# Patient Record
Sex: Male | Born: 1965
Health system: Southern US, Community
[De-identification: ages and names within clinical notes are randomized; demographics above are authoritative.]

## PROBLEM LIST (undated history)

## (undated) DIAGNOSIS — U071 COVID-19: Secondary | ICD-10-CM

## (undated) DIAGNOSIS — I2510 Atherosclerotic heart disease of native coronary artery without angina pectoris: Secondary | ICD-10-CM

## (undated) DIAGNOSIS — N529 Male erectile dysfunction, unspecified: Secondary | ICD-10-CM

## (undated) DIAGNOSIS — B029 Zoster without complications: Secondary | ICD-10-CM

## (undated) DIAGNOSIS — T7840XA Allergy, unspecified, initial encounter: Secondary | ICD-10-CM

## (undated) DIAGNOSIS — Q5310 Unspecified undescended testicle, unilateral: Secondary | ICD-10-CM

## (undated) DIAGNOSIS — K219 Gastro-esophageal reflux disease without esophagitis: Secondary | ICD-10-CM

## (undated) DIAGNOSIS — M48061 Spinal stenosis, lumbar region without neurogenic claudication: Secondary | ICD-10-CM

## (undated) DIAGNOSIS — R1031 Right lower quadrant pain: Secondary | ICD-10-CM

## (undated) DIAGNOSIS — M4310 Spondylolisthesis, site unspecified: Secondary | ICD-10-CM

## (undated) DIAGNOSIS — E781 Pure hyperglyceridemia: Secondary | ICD-10-CM

## (undated) HISTORY — DX: Spondylolisthesis, site unspecified: M43.10

## (undated) HISTORY — DX: Allergy, unspecified, initial encounter: T78.40XA

## (undated) HISTORY — DX: Spinal stenosis, lumbar region without neurogenic claudication: M48.061

## (undated) HISTORY — DX: Gastro-esophageal reflux disease without esophagitis: K21.9

## (undated) HISTORY — DX: Zoster without complications: B02.9

## (undated) HISTORY — PX: FRACTURE SURGERY: SHX138

## (undated) HISTORY — PX: OTHER SURGICAL HISTORY: SHX169

## (undated) HISTORY — DX: Male erectile dysfunction, unspecified: N52.9

## (undated) HISTORY — DX: Right lower quadrant pain: R10.31

## (undated) HISTORY — DX: Unspecified undescended testicle, unilateral: Q53.10

## (undated) HISTORY — DX: COVID-19: U07.1

## (undated) HISTORY — DX: Pure hyperglyceridemia: E78.1

---

## 2002-06-23 ENCOUNTER — Encounter: Admission: RE | Admit: 2002-06-23 | Discharge: 2002-09-21 | Payer: Self-pay | Admitting: Family Medicine

## 2006-06-11 ENCOUNTER — Emergency Department (HOSPITAL_COMMUNITY): Admission: EM | Admit: 2006-06-11 | Discharge: 2006-06-12 | Payer: Self-pay | Admitting: Emergency Medicine

## 2006-06-21 ENCOUNTER — Ambulatory Visit: Payer: Self-pay | Admitting: Family Medicine

## 2006-06-23 ENCOUNTER — Ambulatory Visit: Payer: Self-pay | Admitting: Family Medicine

## 2006-06-23 LAB — CONVERTED CEMR LAB
Chol/HDL Ratio, serum: 5.5
LDL Cholesterol: 89 mg/dL (ref 0–99)
Triglyceride fasting, serum: 139 mg/dL (ref 0–149)

## 2006-10-06 ENCOUNTER — Ambulatory Visit: Payer: Self-pay | Admitting: Family Medicine

## 2006-10-06 LAB — CONVERTED CEMR LAB
Chlamydia, Swab/Urine, PCR: NEGATIVE
GC Probe Amp, Urine: NEGATIVE

## 2006-11-03 ENCOUNTER — Ambulatory Visit: Payer: Self-pay | Admitting: Family Medicine

## 2006-11-08 ENCOUNTER — Ambulatory Visit: Payer: Self-pay | Admitting: Family Medicine

## 2006-11-17 ENCOUNTER — Ambulatory Visit: Payer: Self-pay | Admitting: Family Medicine

## 2006-11-17 ENCOUNTER — Encounter: Payer: Self-pay | Admitting: Family Medicine

## 2006-11-17 DIAGNOSIS — E781 Pure hyperglyceridemia: Secondary | ICD-10-CM | POA: Insufficient documentation

## 2006-11-17 DIAGNOSIS — E785 Hyperlipidemia, unspecified: Secondary | ICD-10-CM | POA: Insufficient documentation

## 2007-01-19 ENCOUNTER — Telehealth (INDEPENDENT_AMBULATORY_CARE_PROVIDER_SITE_OTHER): Payer: Self-pay | Admitting: *Deleted

## 2007-01-20 ENCOUNTER — Ambulatory Visit: Payer: Self-pay | Admitting: Family Medicine

## 2007-01-20 DIAGNOSIS — J309 Allergic rhinitis, unspecified: Secondary | ICD-10-CM | POA: Insufficient documentation

## 2007-01-24 ENCOUNTER — Telehealth: Payer: Self-pay | Admitting: Internal Medicine

## 2007-01-24 ENCOUNTER — Encounter: Admission: RE | Admit: 2007-01-24 | Discharge: 2007-01-24 | Payer: Self-pay | Admitting: Family Medicine

## 2007-01-26 ENCOUNTER — Ambulatory Visit: Payer: Self-pay | Admitting: Internal Medicine

## 2007-02-08 ENCOUNTER — Encounter (INDEPENDENT_AMBULATORY_CARE_PROVIDER_SITE_OTHER): Payer: Self-pay | Admitting: Family Medicine

## 2007-03-03 ENCOUNTER — Ambulatory Visit: Payer: Self-pay | Admitting: Family Medicine

## 2007-03-03 DIAGNOSIS — F528 Other sexual dysfunction not due to a substance or known physiological condition: Secondary | ICD-10-CM | POA: Insufficient documentation

## 2007-03-03 DIAGNOSIS — Q539 Undescended testicle, unspecified: Secondary | ICD-10-CM | POA: Insufficient documentation

## 2007-03-03 DIAGNOSIS — K219 Gastro-esophageal reflux disease without esophagitis: Secondary | ICD-10-CM | POA: Insufficient documentation

## 2007-03-03 LAB — CONVERTED CEMR LAB
Cholesterol: 135 mg/dL (ref 0–200)
LDL Cholesterol: 80 mg/dL (ref 0–99)
Total CHOL/HDL Ratio: 4
Triglycerides: 107 mg/dL (ref 0–149)

## 2007-03-04 ENCOUNTER — Telehealth (INDEPENDENT_AMBULATORY_CARE_PROVIDER_SITE_OTHER): Payer: Self-pay | Admitting: *Deleted

## 2007-03-07 LAB — CONVERTED CEMR LAB
Testosterone Free: 121.5 pg/mL (ref 47.0–244.0)
Testosterone-% Free: 2.8 % (ref 1.6–2.9)

## 2007-04-22 ENCOUNTER — Ambulatory Visit: Payer: Self-pay | Admitting: Family Medicine

## 2007-06-10 ENCOUNTER — Ambulatory Visit: Payer: Self-pay | Admitting: Family Medicine

## 2007-07-22 ENCOUNTER — Telehealth (INDEPENDENT_AMBULATORY_CARE_PROVIDER_SITE_OTHER): Payer: Self-pay | Admitting: *Deleted

## 2007-08-18 HISTORY — PX: VASECTOMY: SHX75

## 2007-08-24 ENCOUNTER — Ambulatory Visit: Payer: Self-pay | Admitting: Family Medicine

## 2007-12-20 ENCOUNTER — Ambulatory Visit: Payer: Self-pay | Admitting: Internal Medicine

## 2008-04-03 ENCOUNTER — Telehealth (INDEPENDENT_AMBULATORY_CARE_PROVIDER_SITE_OTHER): Payer: Self-pay | Admitting: *Deleted

## 2008-04-09 ENCOUNTER — Encounter: Payer: Self-pay | Admitting: Internal Medicine

## 2008-04-12 ENCOUNTER — Ambulatory Visit: Payer: Self-pay | Admitting: Internal Medicine

## 2008-04-12 DIAGNOSIS — R079 Chest pain, unspecified: Secondary | ICD-10-CM | POA: Insufficient documentation

## 2008-04-16 ENCOUNTER — Encounter: Admission: RE | Admit: 2008-04-16 | Discharge: 2008-04-16 | Payer: Self-pay | Admitting: Internal Medicine

## 2008-04-16 ENCOUNTER — Encounter (INDEPENDENT_AMBULATORY_CARE_PROVIDER_SITE_OTHER): Payer: Self-pay | Admitting: *Deleted

## 2008-04-26 ENCOUNTER — Telehealth (INDEPENDENT_AMBULATORY_CARE_PROVIDER_SITE_OTHER): Payer: Self-pay | Admitting: *Deleted

## 2008-04-30 ENCOUNTER — Telehealth (INDEPENDENT_AMBULATORY_CARE_PROVIDER_SITE_OTHER): Payer: Self-pay | Admitting: *Deleted

## 2008-05-24 ENCOUNTER — Ambulatory Visit (HOSPITAL_BASED_OUTPATIENT_CLINIC_OR_DEPARTMENT_OTHER): Admission: RE | Admit: 2008-05-24 | Discharge: 2008-05-24 | Payer: Self-pay | Admitting: Urology

## 2008-10-22 ENCOUNTER — Ambulatory Visit: Payer: Self-pay | Admitting: Internal Medicine

## 2008-10-29 ENCOUNTER — Ambulatory Visit (HOSPITAL_COMMUNITY): Admission: RE | Admit: 2008-10-29 | Discharge: 2008-10-29 | Payer: Self-pay | Admitting: Internal Medicine

## 2008-10-29 ENCOUNTER — Encounter: Payer: Self-pay | Admitting: Internal Medicine

## 2008-10-31 ENCOUNTER — Telehealth (INDEPENDENT_AMBULATORY_CARE_PROVIDER_SITE_OTHER): Payer: Self-pay | Admitting: *Deleted

## 2008-12-13 ENCOUNTER — Telehealth (INDEPENDENT_AMBULATORY_CARE_PROVIDER_SITE_OTHER): Payer: Self-pay | Admitting: *Deleted

## 2009-03-17 DIAGNOSIS — B029 Zoster without complications: Secondary | ICD-10-CM

## 2009-03-17 HISTORY — DX: Zoster without complications: B02.9

## 2009-04-15 ENCOUNTER — Ambulatory Visit: Payer: Self-pay | Admitting: Internal Medicine

## 2009-04-17 ENCOUNTER — Telehealth: Payer: Self-pay | Admitting: Family Medicine

## 2009-07-01 ENCOUNTER — Ambulatory Visit: Payer: Self-pay | Admitting: Internal Medicine

## 2009-07-01 LAB — CONVERTED CEMR LAB
Bilirubin Urine: NEGATIVE
Blood in Urine, dipstick: NEGATIVE
Ketones, urine, test strip: NEGATIVE
Specific Gravity, Urine: 1.01
pH: 7.5

## 2009-07-02 ENCOUNTER — Encounter: Payer: Self-pay | Admitting: Internal Medicine

## 2009-07-04 ENCOUNTER — Encounter (INDEPENDENT_AMBULATORY_CARE_PROVIDER_SITE_OTHER): Payer: Self-pay | Admitting: *Deleted

## 2009-07-30 ENCOUNTER — Ambulatory Visit: Payer: Self-pay | Admitting: Internal Medicine

## 2009-08-07 ENCOUNTER — Telehealth: Payer: Self-pay | Admitting: Internal Medicine

## 2009-09-27 ENCOUNTER — Ambulatory Visit: Payer: Self-pay | Admitting: Internal Medicine

## 2009-10-11 ENCOUNTER — Ambulatory Visit: Payer: Self-pay | Admitting: Internal Medicine

## 2010-06-27 ENCOUNTER — Ambulatory Visit: Payer: Self-pay | Admitting: Internal Medicine

## 2010-06-27 LAB — CONVERTED CEMR LAB
Bacteria, UA: NONE SEEN
Bilirubin Urine: NEGATIVE
Casts: NONE SEEN /lpf
Chlamydia, Swab/Urine, PCR: NEGATIVE
GC Probe Amp, Urine: NEGATIVE
Glucose, Urine, Semiquant: NEGATIVE
Ketones, urine, test strip: NEGATIVE
Specific Gravity, Urine: 1.015
pH: 6

## 2010-06-28 ENCOUNTER — Encounter: Payer: Self-pay | Admitting: Internal Medicine

## 2010-07-07 ENCOUNTER — Encounter: Admission: RE | Admit: 2010-07-07 | Discharge: 2010-07-07 | Payer: Self-pay | Admitting: Internal Medicine

## 2010-09-04 ENCOUNTER — Ambulatory Visit
Admission: RE | Admit: 2010-09-04 | Discharge: 2010-09-04 | Payer: Self-pay | Source: Home / Self Care | Attending: Internal Medicine | Admitting: Internal Medicine

## 2010-09-04 ENCOUNTER — Other Ambulatory Visit: Payer: Self-pay | Admitting: Internal Medicine

## 2010-09-04 LAB — CBC WITH DIFFERENTIAL/PLATELET
Basophils Absolute: 0 10*3/uL (ref 0.0–0.1)
Basophils Relative: 0.6 % (ref 0.0–3.0)
Eosinophils Absolute: 0.1 10*3/uL (ref 0.0–0.7)
Eosinophils Relative: 0.9 % (ref 0.0–5.0)
HCT: 45.4 % (ref 39.0–52.0)
Hemoglobin: 15.8 g/dL (ref 13.0–17.0)
Lymphocytes Relative: 31.7 % (ref 12.0–46.0)
Lymphs Abs: 2.3 10*3/uL (ref 0.7–4.0)
MCHC: 34.8 g/dL (ref 30.0–36.0)
MCV: 88.8 fl (ref 78.0–100.0)
Monocytes Absolute: 0.4 10*3/uL (ref 0.1–1.0)
Monocytes Relative: 6.2 % (ref 3.0–12.0)
Neutro Abs: 4.4 10*3/uL (ref 1.4–7.7)
Neutrophils Relative %: 60.6 % (ref 43.0–77.0)
Platelets: 214 10*3/uL (ref 150.0–400.0)
RBC: 5.11 Mil/uL (ref 4.22–5.81)
RDW: 13.7 % (ref 11.5–14.6)
WBC: 7.3 10*3/uL (ref 4.5–10.5)

## 2010-09-04 LAB — HEPATIC FUNCTION PANEL
ALT: 26 U/L (ref 0–53)
AST: 21 U/L (ref 0–37)
Albumin: 4.5 g/dL (ref 3.5–5.2)
Alkaline Phosphatase: 42 U/L (ref 39–117)
Bilirubin, Direct: 0.2 mg/dL (ref 0.0–0.3)
Total Bilirubin: 1 mg/dL (ref 0.3–1.2)
Total Protein: 7.1 g/dL (ref 6.0–8.3)

## 2010-09-04 LAB — BASIC METABOLIC PANEL
BUN: 15 mg/dL (ref 6–23)
CO2: 30 mEq/L (ref 19–32)
Calcium: 9 mg/dL (ref 8.4–10.5)
Chloride: 101 mEq/L (ref 96–112)
Creatinine, Ser: 1 mg/dL (ref 0.4–1.5)
GFR: 84.98 mL/min (ref >60.00)
Glucose, Bld: 104 mg/dL — ABNORMAL HIGH (ref 70–99)
Potassium: 4.6 mEq/L (ref 3.5–5.1)
Sodium: 135 mEq/L (ref 135–145)

## 2010-09-04 LAB — LIPID PANEL
Cholesterol: 140 mg/dL (ref 0–200)
HDL: 32.6 mg/dL — ABNORMAL LOW (ref >39.00)
LDL Cholesterol: 69 mg/dL (ref 0–99)
Total CHOL/HDL Ratio: 4
Triglycerides: 191 mg/dL — ABNORMAL HIGH (ref 0.0–149.0)
VLDL: 38.2 mg/dL (ref 0.0–40.0)

## 2010-09-04 LAB — TSH: TSH: 0.98 u[IU]/mL (ref 0.35–5.50)

## 2010-09-16 NOTE — Assessment & Plan Note (Signed)
Summary: burning while urinating/kn   Vital Signs:  Patient profile:   45 year old male Height:      71 inches Weight:      253.13 pounds BMI:     35.43 Temp:     98.5 degrees F Pulse rate:   78 / minute Pulse rhythm:   regular BP sitting:   138 / 80  (left arm) Cuff size:   large  Vitals Entered By: Army Fossa CMA (June 27, 2010 1:50 PM) CC: Burning while urinating x 2 days.  Comments [facility] [address]    History of Present Illness: 2 days history of dysuria No gross hematuria, no difficulty urinating Denies any genital rash He does have unprotected sex with his  male  partner  Review of systems No fever No nausea or vomiting No abdominal pain or CVA tenderness No penile discharge chart reviewed, Similar symptoms 11-10, urine culture negative, was treated with doxycycline per  Dr. Alwyn Ren  Current Medications (verified): 1)  Xyzal 5 Mg  Tabs (Levocetirizine Dihydrochloride) .Marland Kitchen.. 1 By Mouth Once Daily 2)  Nexium 40 Mg  Cpdr (Esomeprazole Magnesium) .Marland Kitchen.. 1 By Mouth Once Daily 3)  Cialis 20 Mg Tabs (Tadalafil) .... Take One Tablet Every Other Day As Needed  Allergies (verified): 1)  ! * Antihistamines 2)  ! Asa 3)  ! Pcn  Past History:  Past Medical History: Reviewed history from 04/15/2009 and no changes required. ALLERGIC RHINITIS  HYPERTRIGLYCERIDEMIA ERECTILE DYSFUNCTION  GERD  UNDESCENDED TESTICLE s/p surgery as a child CP 04/09/2008 ---> stress test  (nl)  and ECHO (Nl), GB u/s neg; had a previous eval by cards as well shingles 03-2009  Past Surgical History: surgery for undescended testicle --R-- Vasectomy approximately 2009  Social History: Reviewed history from 10/11/2009 and no changes required. 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   Abdomen:  soft, non-tender, no distention, no masses, no guarding, no rigidity, and no  inguinal hernia.  no CVA tenderness Rectal:  No external abnormalities noted. Normal sphincter tone. No rectal masses or tenderness. Genitalia:  circumcised.   no pain no discharge No genital rash He has a normal left testicle Right testicle is not in the scrotum. palpation of the right side  spermatic cordom  trajectory--area  is  slightly full and tender, patient reports that is the way it has been for years Prostate:  Prostate gland firm and smooth, no enlargement, nodularity, tenderness, mass, asymmetry or induration.   Impression & Recommendations:  Problem # 1:  ? of UTI (ICD-599.0) ? UTI, ? Urethritis Plan: Urine culture, G&C Empiric antibiotics , doxycycline He is allergic to penicillin, will use cephalosporin only if the G&C is positive    Orders: UA Dipstick w/o Micro (automated)  (81003) T-Chlamydia  Probe, urine [id number]) T-GC Probe, urine [id number]) T-Urine Microscopic (29562-13086) T-Culture, Urine ([id number])  Problem # 2:  UNDESCENDED TESTICLE (ICD-752.51)  see physical exam, right side of the scrotum is empty, history of right undescended testicle. Slightly tender at the spermatic cord area We'll request ultrasound, patient agreed  Orders: Radiology Referral (Radiology)  Complete Medication List: 1)  Xyzal 5 Mg Tabs (Levocetirizine dihydrochloride) .Marland Kitchen.. 1 by mouth once daily 2)  Nexium 40 Mg Cpdr (Esomeprazole magnesium) .Marland Kitchen.. 1 by mouth once daily 3)  Cialis 20 Mg Tabs (Tadalafil) .... Take one tablet every other day as needed 4)  Doxycycline Hyclate 100  Mg Caps (Doxycycline hyclate) .... One by mouth twice a day for one week  Patient Instructions: 1)  please come back in 2 weeks if you are not better Prescriptions: DOXYCYCLINE HYCLATE 100 MG CAPS (DOXYCYCLINE HYCLATE) one by mouth twice a day for one week  #14 x 0   Entered and Authorized by:   Nolon Rod. Paz MD   Signed by:   Nolon Rod. Paz MD on 06/27/2010   Method used:   Electronically to         [facility] [phone]* (retail)       [address]       [place], [place]  [address]       Ph: [phone]       Fax: [phone]   RxID:   [id number]    Orders Added: 1)  UA Dipstick w/o Micro (automated)  [81003] 2)  T-Chlamydia  Probe, urine [id number] 3)  T-GC Probe, urine [id number] 4)  T-Urine Microscopic [[id number]] 5)  T-Culture, Urine [[id number]] 6)  Radiology Referral [Radiology] 7)  Est. Patient Level IV [01093]    Laboratory Results   Urine Tests    Routine Urinalysis   Color: yellow Appearance: Clear Glucose: negative   (Normal Range: Negative) Bilirubin: negative   (Normal Range: Negative) Ketone: negative   (Normal Range: Negative) Spec. Gravity: 1.015   (Normal Range: 1.003-1.035) Blood: negative   (Normal Range: Negative) pH: 6.0   (Normal Range: 5.0-8.0) Protein: negative   (Normal Range: Negative) Urobilinogen: 0.2   (Normal Range: 0-1) Nitrite: negative   (Normal Range: Negative) Leukocyte Esterace: negative   (Normal Range: Negative)    Comments: Army Fossa CMA  June 27, 2010 1:54 PM

## 2010-09-16 NOTE — Assessment & Plan Note (Signed)
Summary: SINUS INFECTION?/KDC   Vital Signs:  Patient profile:   45 year old male Height:      71 inches Weight:      245.4 pounds Temp:     98.6 degrees F BP sitting:   120 / 86  Vitals Entered By: Shary Decamp (September 27, 2009 8:42 AM) CC: acute only Comments  - sick x 2/6  - HA, congestion, runny nose, ST Stacia Hawks  September 27, 2009 8:48 AM    History of Present Illness: as above  headache is mostly frontal  Current Medications (verified): 1)  Xyzal 5 Mg  Tabs (Levocetirizine Dihydrochloride) .Marland Kitchen.. 1 By Mouth Once Daily 2)  Nexium 40 Mg  Cpdr (Esomeprazole Magnesium) .Marland Kitchen.. 1 By Mouth Once Daily  Allergies (verified): 1)  ! * Antihistamines 2)  ! Asa 3)  ! Pcn  Past History:  Past Medical History: Reviewed history from 04/15/2009 and no changes required. ALLERGIC RHINITIS  HYPERTRIGLYCERIDEMIA ERECTILE DYSFUNCTION  GERD  UNDESCENDED TESTICLE s/p surgery as a child CP 04/09/2008 ---> stress test  (nl)  and ECHO (Nl), GB u/s neg; had a previous eval by cards as well shingles 03-2009  Past Surgical History: Reviewed history from 04/12/2008 and no changes required. surgery for undescended testicle  Social History: Reviewed history from 10/22/2008 and no changes required. 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   nontoxic Head:  face is symmetric, not tender to the patient Ears:  both TMs bulge and slight erect, no discharge Nose:  quite congested Mouth:  no redness or discharge Lungs:  normal respiratory effort, no intercostal retractions, and no accessory muscle use.  few rhonchi Heart:  normal rate, regular rhythm, and no murmur.     Impression & Recommendations:  Problem # 1:  URI (ICD-465.9) URI symptoms plus  B OMA patient is allergic to penicillin, prescribed Bactrim His updated medication list for this problem includes:    Xyzal 5 Mg Tabs (Levocetirizine dihydrochloride) .Marland Kitchen... 1 by mouth once daily  Complete Medication List: 1)  Xyzal 5 Mg Tabs (Levocetirizine dihydrochloride) .Marland Kitchen.. 1 by mouth once daily 2)  Nexium 40 Mg Cpdr (Esomeprazole magnesium) .Marland Kitchen.. 1 by mouth once daily 3)  Bactrim Ds 800-160 Mg Tabs (Sulfamethoxazole-trimethoprim) .... One by mouth twice a day  Patient Instructions: 1)  rest, take a lot of fluids 2)  Tylenol 3)  mucinex 4)  Bactrim, antibiotic 5)  call if not better in a few days Prescriptions: BACTRIM DS 800-160 MG TABS (SULFAMETHOXAZOLE-TRIMETHOPRIM) one by mouth twice a day  #14 x 0   Entered and Authorized by:   Nolon Rod. Shenetta Schnackenberg MD   Signed by:   Nolon Rod. Nickson Middlesworth MD on 09/27/2009   Method used:   Electronically to        [facility] [phone]* (retail)       [address]       [place], [place]  [address]       Ph: [phone]       Fax: [phone]   RxID:   [id number]

## 2010-09-16 NOTE — Assessment & Plan Note (Signed)
Summary: cough/runny nose/kdc   Vital Signs:  Patient profile:   45 year old male Weight:      248 pounds Temp:     98.4 degrees F oral BP sitting:   122 / 76  (right arm)  Vitals Entered By: Doristine Devoid (October 11, 2009 3:36 PM) CC: cough and chest congestion also some sinus drainage    History of Present Illness: he was seen here 2/11, diagnosed with a URI and OMA  he was prescribed Bactrim, he took several days before he felt better but he never recuperated 100%  symptoms  but worse a few days ago:  chest and nose congestion ++ cough  Allergies: 1)  ! * Antihistamines 2)  ! Asa 3)  ! Pcn  Past History:  Past Medical History: Reviewed history from 04/15/2009 and no changes required. 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   Ears:  R ear: no redness,TM essentially normal  L ear: TM slightly bulging and red Nose:  mild congestion Mouth:  no redness Lungs:  few rhonchi with few end expiratory wheezing Heart:  normal rate, regular rhythm, no murmur, and no gallop.     Impression & Recommendations:  Problem # 1:  URI (ICD-465.9) lingering symptoms since DX  of URI a couple of weeks ago at this point, his chest seems congested, he has some wheezing (bronchitis type of symptoms) plan: Z-Pak, prednisone, samples of albuterol (sample) is not better next week, will get a  chest x-ray see  instructions His updated medication list for this problem includes:   Xyzal 5 Mg Tabs (Levocetirizine dihydrochloride) .Marland Kitchen... 1 by mouth once daily  Complete Medication List: 1)  Xyzal 5 Mg Tabs (Levocetirizine dihydrochloride) .Marland Kitchen.. 1 by mouth once daily 2)  Nexium 40 Mg Cpdr (Esomeprazole magnesium) .Marland Kitchen.. 1 by mouth once daily 3)  Zithromax Z-pak 250 Mg Tabs (Azithromycin) .... As directed 4)  Prednisone 10 Mg Tabs (Prednisone) .... 4 by mouth once daily x 2, 3x2, 2x2, 1x2  Patient Instructions: 1)  rest, take a lot of fluids  2)  mucinex DM twice a day for one week 3)  Zpack , antibiotic 4)  prednisone as prescribed 5)  albuterol two puffs every 6  hours as needed for persistent cough or wheezing 6)  call if not better  Monday  Prescriptions: PREDNISONE 10 MG TABS (PREDNISONE) 4 by mouth once daily x 2, 3x2, 2x2, 1x2  #20 x 0   Entered and Authorized by:   Jose E. Paz MD   Signed by:   Nolon Rod. Paz MD on 10/11/2009   Method used:   Electronically to        [facility] [phone]* (retail)       [address]       [place], [place]  [address]       Ph: [phone]       Fax: [phone]   RxID:   [id number] Rhys Martini  250 MG TABS (AZITHROMYCIN) as directed  #1 x 0   Entered and Authorized by:   Nolon Rod. Paz MD   Signed by:   Nolon Rod. Paz MD on 10/11/2009   Method used:   Electronically to        [facility] [phone]* (retail)       [address]       [place], [place]  [address]       Ph: [phone]       Fax: [phone]   RxID:   [id number]

## 2010-09-18 NOTE — Assessment & Plan Note (Signed)
Summary: CPX/FASTING/KN   Vital Signs:  Patient profile:   45 year old male Height:      71 inches Weight:      256 pounds BMI:     35.83 Pulse rate:   65 / minute Pulse rhythm:   regular BP sitting:   126 / 78  (left arm) Cuff size:   large  Vitals Entered By: Army Fossa CMA (September 04, 2010 10:13 AM) CC: CPX, fasting  Comments "ongoing problem that he feels like he is getting to have a stroke"  print rx's    History of Present Illness: CPX  continue with random episodes of left sided chest discomfort, tingling in the fingers. Does not feel anxious with the onset of symptoms but then he gets nervous about it. Report several previous stress test that were negative, several ER visits.  Also complained of feeling fatigue and snoring. No inappropriate falling asleep.  Sinus symptoms for a few weeks, nasal congestion, occasional green discharge. No fever Some itchy eyes, no itchy nose. Allergies? Infection?  Current Medications (verified): 1)  Xyzal 5 Mg  Tabs (Levocetirizine Dihydrochloride) .Marland Kitchen.. 1 By Mouth Once Daily 2)  Nexium 40 Mg  Cpdr (Esomeprazole Magnesium) .Marland Kitchen.. 1 By Mouth Once Daily 3)  Cialis 20 Mg Tabs (Tadalafil) .... Take One Tablet Every Other Day As Needed  Allergies (verified): 1)  ! * Antihistamines 2)  ! Asa 3)  ! Pcn  Past History:  Past Medical History: ALLERGIC RHINITIS  HYPERTRIGLYCERIDEMIA ERECTILE DYSFUNCTION  GERD  UNDESCENDED TESTICLE s/p surgery as a child-------u/s 11-11 "absent R testicle" CP 04/09/2008 ---> stress test  (nl)  and ECHO (Nl), GB u/s neg; had a previous eval by cards as well shingles 03-2009  Past Surgical History: Reviewed history from 06/27/2010 and no changes required. 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 ENT:  allergies-- xyzol helps in spring . Resp:  Denies cough and wheezing. GI:  Denies bloody stools, diarrhea, nausea, and vomiting. GU:  Denies dysuria, urinary frequency, and urinary hesitancy.  Physical Exam  General:  alert, well-developed, and overweight-appearing.   Head:  face symmetric, nontender to palpation Ears:  TMs bulging bilaterally Nose:  slightly congested Mouth:  no redness or discharge Lungs:  normal respiratory effort, no intercostal retractions, no accessory muscle use, and normal breath sounds.   Heart:  normal rate, regular rhythm, no murmur, and no gallop.   Abdomen:  soft, non-tender, no distention, no masses, no guarding, and no rigidity.   Extremities:  no lower extremity edema   Impression & Recommendations:  Problem # 1:  EXAMINATION, ROUTINE MEDICAL (ICD-V70.0) Td 2005 recommend a flu shot counseled about diet-exercise-loose wt (BMI 35) ----Weight Watchers? labs   Orders: Venipuncture (04540) TLB-BMP (Basic Metabolic Panel-BMET) (80048-METABOL) TLB-CBC Platelet - w/Differential (85025-CBCD) TLB-Lipid Panel (80061-LIPID) TLB-TSH (Thyroid Stimulating Hormone) (84443-TSH) TLB-Hepatic/Liver Function Pnl (80076-HEPATIC) Specimen Handling (98119)  Problem # 2:  CHEST PAIN (ICD-786.50) continue with episodic chest pain associated with other symptoms, see history of present illness and PMH  reports several ER visits and previews negative stress tests I think his options are : -cardiology reevaluation -Xanax  as needed, anxiety? He elected Xanax as needed. Patient will let me know if that helps. If symptoms severe, go to the ER  Problem # 3:  ? of UNSPECIFIED SLEEP APNEA (ICD-780.57) sleep apnea? The patient complained of lack of energy, is overweight, has a short neck. He is a  sleepy through the day although he smokes one sleeping appropriately. We discussed apneaLink . He will call me if interested  Problem # 4:  ALLERGIC RHINITIS (ICD-477.9) he does have symptoms consistent with allergic rhinitis, TMs are bulging. Recommend to go back xyzal , we'll prescribe steroids for a few days His updated medication list for this problem includes:    Xyzal 5 Mg Tabs (Levocetirizine dihydrochloride) .Marland Kitchen... 1 by mouth once daily  Problem # 5:   his  additiono to his  CPX, we assesed  3 other issues, see above   Complete Medication List: 1)  Xyzal 5 Mg Tabs (Levocetirizine dihydrochloride) .Marland Kitchen.. 1 by mouth once daily 2)  Nexium 40 Mg Cpdr (Esomeprazole magnesium) .Marland Kitchen.. 1 by mouth once daily 3)  Cialis 20 Mg Tabs (Tadalafil) .... Take one tablet every other day as needed 4)  Prednisone 10 Mg Tabs (Prednisone) .... 4 by mouth once daily x 2, 3x2,2x2,1x2 5)  Alprazolam 0.5 Mg Tabs (Alprazolam) .Marland Kitchen.. 1 or 2  by mouth two times a day as needed for chest pain  Patient Instructions: 1)  xyzal x 3 weeks 2)  prednisone for few days as prescribed 3)  mucinex OTC two times a day x 1 week , then as needed  4)  call if allergies not better in few days  5)  Please schedule a follow-up appointment in 6 months .  Prescriptions: PREDNISONE 10 MG TABS (PREDNISONE) 4 by mouth once daily x 2, 3x2,2x2,1x2  #20 x 0   Entered and Authorized by:   Nolon Rod. Isaia Hassell MD   Signed by:   Nolon Rod. Jarnell Cordaro MD on 09/04/2010   Method used:   Print then Give to Patient   RxID:   [id number] NEXIUM 40 MG  CPDR (ESOMEPRAZOLE MAGNESIUM) 1 by mouth once daily  #90 x 3   Entered and Authorized by:   Nolon Rod. Landy Mace MD   Signed by:   Nolon Rod. Jenah Vanasten MD on 09/04/2010   Method used:   Print then Give to Patient   RxID:   [id number] XYZAL 5 MG  TABS (LEVOCETIRIZINE DIHYDROCHLORIDE) 1 by mouth once daily  #90 x 3   Entered and Authorized by:   Nolon Rod. Kollins Fenter MD   Signed by:   Nolon Rod. Emony Dormer MD on 09/04/2010   Method used:    Print then Give to Patient   RxID:   [id number] ALPRAZOLAM 0.5 MG TABS (ALPRAZOLAM) 1 or 2  by mouth two times a day as needed for chest pain  #15 x 0   Entered and Authorized by:   Nolon Rod. Ariana Cavenaugh MD   Signed by:   Nolon Rod. Jahmar Mckelvy MD on 09/04/2010   Method used:   Print then Give to Patient   RxID:   [id number]    Orders Added: 1)  Venipuncture [42595] 2)  TLB-BMP (Basic Metabolic Panel-BMET) [80048-METABOL] 3)  TLB-CBC Platelet - w/Differential [85025-CBCD] 4)  TLB-Lipid Panel [80061-LIPID] 5)  TLB-TSH (Thyroid Stimulating Hormone) [84443-TSH] 6)  TLB-Hepatic/Liver Function Pnl [80076-HEPATIC] 7)  Specimen Handling [99000] 8)  Est. Patient Level III [63875] 9)  Est. Patient age 78-64 [phone]

## 2010-09-22 ENCOUNTER — Telehealth (INDEPENDENT_AMBULATORY_CARE_PROVIDER_SITE_OTHER): Payer: Self-pay | Admitting: *Deleted

## 2010-10-02 NOTE — Progress Notes (Signed)
Summary: Refill Request  Phone Note Refill Request Call back at [phone] Message from:  Pharmacy on September 22, 2010 8:44 AM  Refills Requested: Medication #1:  NEXIUM 40 MG  CPDR 1 by mouth once daily   Dosage confirmed as above?Dosage Confirmed   Supply Requested: 3 months   Last Refilled: 09/04/2010   Notes: New Rx [facility]  Next Appointment Scheduled: none Initial call taken by: Harold Barban,  September 22, 2010 8:45 AM  Follow-up for Phone Call        needs to go to [facility].    Prescriptions: NEXIUM 40 MG  CPDR (ESOMEPRAZOLE MAGNESIUM) 1 by mouth once daily  #90 x 3   Entered by:   Army Fossa CMA   Authorized by:   Nolon Rod. Paz MD   Signed by:   Army Fossa CMA on 09/22/2010   Method used:   Faxed to ...       [facility] (mail-order)       [address] [place], [place]  [address]       Ph: [phone]       Fax: [phone]   RxID:   [id number]

## 2010-10-15 ENCOUNTER — Encounter: Payer: Self-pay | Admitting: Internal Medicine

## 2010-10-15 ENCOUNTER — Ambulatory Visit (INDEPENDENT_AMBULATORY_CARE_PROVIDER_SITE_OTHER): Payer: Managed Care, Other (non HMO) | Admitting: Internal Medicine

## 2010-10-15 DIAGNOSIS — J019 Acute sinusitis, unspecified: Secondary | ICD-10-CM | POA: Insufficient documentation

## 2010-10-15 DIAGNOSIS — G4730 Sleep apnea, unspecified: Secondary | ICD-10-CM

## 2010-10-23 NOTE — Assessment & Plan Note (Signed)
Summary: [email or web address].cbs   Vital Signs:  Patient profile:   45 year old male Weight:      257.38 pounds Temp:     98.7 degrees F oral Pulse rate:   84 / minute Pulse rhythm:   regular BP sitting:   126 / 86  (left arm) Cuff size:   large  Vitals Entered By: Army Fossa CMA (October 15, 2010 9:51 AM) CC: Head congestion, HA, minor chest congestion Comments x 1 1/2 weeks tried mucinex [facility] [address]    History of Present Illness:   symptoms started 10 days ago, has moderate head congestion, some chest congestion. Frontal headache on and off, sore throat and postnasal dripping  Mucinex did not help much   also would like a sleep apnea test, see previous office visit note  Current Medications (verified): 1)  Xyzal 5 Mg  Tabs (Levocetirizine Dihydrochloride) .Marland Kitchen.. 1 By Mouth Once Daily 2)  Nexium 40 Mg  Cpdr (Esomeprazole Magnesium) .Marland Kitchen.. 1 By Mouth Once Daily 3)  Cialis 20 Mg Tabs (Tadalafil) .... Take One Tablet Every Other Day As Needed  Allergies (verified): 1)  ! * Antihistamines 2)  ! Asa 3)  ! Pcn  Past History:  Past Medical History: Reviewed history from 09/04/2010 and no changes required. 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 surgery for undescended testicle --R-- Vasectomy approximately 2009  Social History: Reviewed history from 09/04/2010 and no changes required. 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 Resp:  Denies cough and sputum productive. GI:  had N last week, no V-D.  Physical  Exam  General:  alert and well-developed.   no apparent distress Head:   face symmetric, not tender to palpation Ears:   tympanic membranes slightly bulged, no red Nose:   slightly congested Mouth:   no redness or discharge Lungs:  normal respiratory effort, no intercostal retractions, no accessory muscle use, and normal breath sounds.     Impression & Recommendations:  Problem # 1:  SINUSITIS - ACUTE-NOS (ICD-461.9)  mild sinusitis he is allergic to penicillin, see instructions His updated medication list for this problem includes:    Flonase 50 Mcg/act Susp (Fluticasone propionate) .Marland Kitchen... 2 sprays on each side of the nose once daily    Zithromax Z-pak 250 Mg Tabs (Azithromycin) .Marland Kitchen... As directed  Problem # 2:  ? of UNSPECIFIED SLEEP APNEA (ICD-780.57)  sleep apnea?  he would like to proceed with a apneaLink    Will schedule once he is completely well from his sinusitis  Orders: Pulmonary Referral (Pulmonary)  Complete Medication List: 1)  Xyzal 5 Mg Tabs (Levocetirizine dihydrochloride) .Marland Kitchen.. 1 by mouth once daily 2)  Nexium 40 Mg Cpdr (Esomeprazole magnesium) .Marland Kitchen.. 1 by mouth once daily 3)  Cialis 20 Mg Tabs (Tadalafil) .... Take one tablet every other day as needed 4)  Flonase 50 Mcg/act Susp (Fluticasone propionate) .... 2 sprays on each side of the nose once daily 5)  Zithromax Z-pak 250 Mg Tabs (Azithromycin) .... As directed  Patient Instructions: 1)  rest, fluids, tylenol 2)  mucinex  3)  flonase 2 sprays on each side of the nose  every day fo few weeks  4)  zpack 5)  call if no better in 2 weeks  Prescriptions: ZITHROMAX Z-PAK 250 MG TABS (AZITHROMYCIN) as directed  #1 x 0   Entered by:   Army Fossa CMA   Authorized by:   Nolon Rod. Jasmeet Manton MD   Signed by:   Army Fossa CMA on 10/15/2010   Method used:   Electronically to        [id number]* (retail)       [address]       [place], [place]  [address]        Ph: [phone]       Fax: [phone]   RxID:   [id number] FLONASE 50 MCG/ACT SUSP (FLUTICASONE PROPIONATE) 2 sprays on each side of the nose once daily  #1 x 1   Entered by:   Army Fossa CMA   Authorized by:   Nolon Rod. Chenille Toor MD   Signed by:   Army Fossa CMA on 10/15/2010   Method used:   Electronically to        [id number]* (retail)       [address]       [place], [place]  [address]       Ph: [phone]       Fax: [phone]   RxID:   [id number] ZITHROMAX Z-PAK 250 MG TABS (AZITHROMYCIN) as directed  #1 x 0   Entered and Authorized by:   Nolon Rod. Kataryna Mcquilkin MD   Signed by:   Nolon Rod. Shylin Keizer MD on 10/15/2010   Method used:   Print then Give to Patient   RxID:   [id number] FLONASE 50 MCG/ACT SUSP (FLUTICASONE PROPIONATE) 2 sprays on each side of the nose once daily  #1 x 1   Entered and Authorized by:   Elita Quick E. Abhimanyu Cruces MD   Signed by:   Nolon Rod. Kalyani Maeda MD on 10/15/2010   Method used:   Print then Give to Patient   RxID:   [id number] ZITHROMAX Z-PAK 250 MG TABS (AZITHROMYCIN) as directed  #1 x 0   Entered and Authorized by:   Nolon Rod. Hoa Briggs MD   Signed by:   Nolon Rod. Naser Schuld MD on 10/15/2010   Method used:   Electronically to        [facility] [phone]* (retail)       [address]       [place], [place]  [address]       Ph: [phone]       Fax: [phone]   RxID:   [id number] FLONASE 50 MCG/ACT SUSP (FLUTICASONE PROPIONATE) 2 sprays on each side of the nose once daily  #1 x 1   Entered and Authorized by:   Nolon Rod. Dariela Stoker MD   Signed by:   Nolon Rod. Camdon Saetern MD on 10/15/2010   Method used:   Electronically to        [facility] [phone]* (retail)       [address]       [place], [place]  [address]       Ph: [phone]       Fax: [phone]   RxID:   [id number]    Orders Added: 1)  Pulmonary Referral [Pulmonary] 2)  Est. Patient Level III [id number]

## 2010-11-03 ENCOUNTER — Ambulatory Visit (INDEPENDENT_AMBULATORY_CARE_PROVIDER_SITE_OTHER): Payer: Managed Care, Other (non HMO) | Admitting: Pulmonary Disease

## 2010-11-03 ENCOUNTER — Encounter: Payer: Self-pay | Admitting: Pulmonary Disease

## 2010-11-03 DIAGNOSIS — G4733 Obstructive sleep apnea (adult) (pediatric): Secondary | ICD-10-CM | POA: Insufficient documentation

## 2010-11-13 NOTE — Assessment & Plan Note (Signed)
Summary: apena link only/cb   Primary Provider/Referring Provider:  lown   History of Present Illness: APNEALINK HOME SLEEP STUDY REPORT:  45 yo male with sleep disruption, daytimes sleepiness, and snoring.  Sampling time 7hrs 6 min.  Valid recording time 5hr .  Average respiratory rate 15.  AHI 1, RDI 6.  All obstructive events.  Average SpO2 95%, low 87%.  Spent 1 min with SpO2 < 89%. ODI 5.  Average heart rate 68, range 58 to 99.  Allergies: 1)  ! * Antihistamines 2)  ! Asa 3)  ! Pcn  Past History:  Past Medical History: Reviewed history from 09/04/2010 and no changes required. 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 surgery for undescended testicle --R-- Vasectomy approximately 2009  Family History: Reviewed history from 09/04/2010 and no changes required. Asthma--M Diabetes  -- F, S  MI--no bone cancer- brother breast ca-- S colon ca--no  prostate ca--no  Social History: Reviewed history from 09/04/2010 and no changes required. 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  His Respiratory Disturbance Index and Oxygen Desaturation Index are elevated, suggesting that he may have sleep apnea.  However, his Apnea Hypopnea Index is in normal range.  Would recommend that the patient undergo weight loss attempts first.  If he has symptoms of sleepiness interferring with daytime function, then could consider in-lab overnight sleep  study to further assess.  Alternative would be to arrange for formal sleep consultation.  Complete Medication List: 1)  Xyzal 5 Mg Tabs (Levocetirizine dihydrochloride) .Marland Kitchen.. 1 by mouth once daily 2)  Nexium 40 Mg Cpdr (Esomeprazole magnesium) .Marland Kitchen.. 1 by mouth once daily 3)  Cialis 20 Mg Tabs (Tadalafil) .... Take one tablet every other day as needed 4)  Flonase 50 Mcg/act Susp (Fluticasone propionate) .... 2 sprays on each side of the nose once daily 5)  Zithromax Z-pak 250 Mg Tabs (Azithromycin) .... As directed  Other Orders: Sleep Std Airflow/Heartrate and O2 SAT unattended ([address])

## 2010-11-19 ENCOUNTER — Telehealth: Payer: Self-pay | Admitting: Internal Medicine

## 2010-11-19 DIAGNOSIS — G4730 Sleep apnea, unspecified: Secondary | ICD-10-CM

## 2010-11-19 NOTE — Telephone Encounter (Signed)
Advise patient, apnea link showed borderline findings for sleep apnea. Options: -Weight loss, see his dentist and get an appliance to help with snoring and reassess in one year -Consult to discuss results with pulmonary [facility]

## 2010-11-19 NOTE — Telephone Encounter (Signed)
I spoke w/ pt he would like a referral to pulmonary.

## 2010-11-24 ENCOUNTER — Encounter: Payer: Self-pay | Admitting: Pulmonary Disease

## 2010-12-12 ENCOUNTER — Encounter: Payer: Self-pay | Admitting: Pulmonary Disease

## 2010-12-16 ENCOUNTER — Ambulatory Visit (INDEPENDENT_AMBULATORY_CARE_PROVIDER_SITE_OTHER): Payer: Managed Care, Other (non HMO) | Admitting: Pulmonary Disease

## 2010-12-16 ENCOUNTER — Encounter: Payer: Self-pay | Admitting: Pulmonary Disease

## 2010-12-16 VITALS — BP 112/70 | HR 60 | Temp 98.4°F | Ht 71.0 in | Wt 258.6 lb

## 2010-12-16 DIAGNOSIS — G4733 Obstructive sleep apnea (adult) (pediatric): Secondary | ICD-10-CM

## 2010-12-16 NOTE — Progress Notes (Signed)
  Subjective:    Patient ID: Timothy [name], male    DOB: [birth year], 45 y.o.   MRN: [id number]  HPI The pt is a 45y/o male who I have been asked to see for possible osa.  He has recently undergone home sleep testing where he was found to have a normal AHI, but clinically he has had symptoms c/w sleep disordered breathing.  His history is significant for the following: - loud snoring with abnormal breathing pattern during sleep. -awakenings at night, nonrestorative sleep   -definite sleep pressure during the day with periods of inactivity, but not dysfunctional -dozes with tv/movies in the evening, some sleep pressure with driving longer distances.  -epworth 12.    Sleep Questionnaire: What time do you typically go to bed?( Between what hours) 11 pm How long does it take you to fall asleep? 1 hr + How many times during the night do you wake up? 3 What time do you get out of bed to start your day? 0700 Do you drive or operate heavy machinery in your occupation? No How much has your weight changed (up or down) over the past two years? (In pounds) 10 lb (4.536 kg) Have you ever had a sleep study before? Yes If yes, location of study? home sleep study/overnight oximetry. If yes, date of study? Do you currently use CPAP? No Do you wear oxygen at any time? No    Review of Systems  Constitutional: Negative for fever and unexpected weight change.  HENT: Positive for congestion and sneezing. Negative for ear pain, nosebleeds, sore throat, rhinorrhea, trouble swallowing, dental problem, postnasal drip and sinus pressure.   Eyes: Negative for redness and itching.  Respiratory: Negative for cough, chest tightness, shortness of breath and wheezing.   Cardiovascular: Negative for palpitations and leg swelling.  Gastrointestinal: Negative for nausea and vomiting.  Genitourinary: Negative for dysuria.  Musculoskeletal: Negative for joint swelling.  Skin: Negative for rash.  Neurological: Negative for  headaches.  Hematological: Does not bruise/bleed easily.  Psychiatric/Behavioral: Negative for dysphoric mood. The patient is not nervous/anxious.        Objective:   Physical Exam Constitutional: overweight male, no acute distress  HENT:  Nares patent without discharge, mild septal deviation to left  Oropharynx without exudate, palate and uvula are elongated.  Mild tonsillar hypertrophy  Eyes:  Perrla, eomi, no scleral icterus  Neck:  No JVD, no TMG  Cardiovascular:  Normal rate, regular rhythm, no rubs or gallops.  No murmurs        Intact distal pulses  Pulmonary :  Normal breath sounds, no stridor or respiratory distress   No rales, rhonchi, or wheezing  Abdominal:  Soft, nondistended, bowel sounds present.  No tenderness noted.   Musculoskeletal:  No lower extremity edema noted.  Lymph Nodes:  No cervical lymphadenopathy noted  Skin:  No cyanosis noted  Neurologic: appears sleepy, appropriate, moves all 4 extremities without obvious deficit.         Assessment & Plan:

## 2010-12-16 NOTE — Patient Instructions (Signed)
Work aggressively on weight loss over the next 6mos, and see how things go. If you feel you are not making progress, or if symptoms are greatly interfering with your quality of life, call and we can set up formal sleep study.

## 2010-12-16 NOTE — Assessment & Plan Note (Signed)
The pt's history is classic for sleep disordered breathing, although his home sleep test does not show significant OSA.  I suspect he has the upper airway resistance syndrome, or perhaps he had a "good night" with the home sleep testing.  Regardless, I do not think he has moderate or severe osa, which would have been picked up by home study.  At this point, we only have 2 options to consider.  He can take the next 6mos and work aggressively on weight loss, versus having a formal npsg at the sleep center for evaluation.  The pt would like to work on weight loss first and see how he does.

## 2010-12-30 NOTE — Op Note (Signed)
NAME:  Timothy [name], Timothy [name] NO.:  [id number]   MEDICAL RECORD NO.:  [id number]          PATIENT TYPE:  AMB   LOCATION:  [facility]                         FACILITY:  [facility]   PHYSICIAN:  Excell Seltzer. Annabell Howells, M.D.    DATE OF BIRTH:  Dec 29, 1965   DATE OF PROCEDURE:  05/24/2008  DATE OF DISCHARGE:                               OPERATIVE REPORT   PREOPERATIVE DIAGNOSIS:  Desires permanent sterility.   POSTOPERATIVE DIAGNOSIS:  Desires permanent sterility.   PROCEDURE:  1. Right inguinal exploration.  2. Bilateral vasectomy.   ATTENDING PHYSICIAN:  Excell Seltzer. Annabell Howells, M.D.   RESIDENT PHYSICIAN:  Dr. Delman Kitten.   ANESTHESIA:  Was general plus local.   INDICATIONS FOR PROCEDURE:  Mr. Rossin is a 45 year old white male who  has a 69 year old son and desires permanent sterility.  He did have a  right undescended testicle that was addressed at age 8 but the exact  procedure is unknown.  Preoperatively he and Dr. Annabell Howells discussed all  risks, benefits, consequences and concerns regarding vasectomy and  because of his underlying anatomic abnormality it was decided to do this  procedure in the operating room.   PROCEDURE IN DETAIL:  The patient is brought to the operating room and  placed in supine position.  He was correctly identified by his wristband  and appropriate time-out was taken.  IV antibiotics were administered  and general anesthesia was delivered.  Once adequately anesthetized his  scrotum was clipped, prepped and draped in the normal sterile fashion.  We began our procedure by performing physical examination and his left  testicle was descended with palpably normal spermatic cord.  The vas  deferens was easily identified.  The right hemiscrotum was empty and an  atrophic testicle was palpated just distal to the right superficial  inguinal ring.  It appeared to be tethered in this position and was not  mobile.  We could palpate the spermatic cord and the vas deferens but,  again, there was a fair amount of tissue involved and it was difficult  to isolate the vas deferens.  In any case we elected to began by  proceeding forth with left-sided vasectomy in a no scalpel technique.  We isolated the vas deferens and brought it up to the level of the skin.  We then used the needlepoint forceps and made a small puncture in the  scrotal skin in the midline or just to the patient's right of midline  and then spread and we were able to get a ring forceps around the vas.  We then dissected all the perivasal tissues to get down to the vas  itself.  We then isolated this with two towel clips.  A hemostat was  placed over the vas deferens and an intervening segment of the vas was  amputated sharply with scissors.  We used needlepoint electrocautery to  cauterize both proximal and distal lumens of the left vas.  The distal  end of the vas was then tucked under some dartos tissue and the dartos  was closed over it using 3-0 chromic suture  thus isolating the two ends.  The proximal aspect of the vas was then ligated with 3-0 Vicryl.  We  then replaced both vas structures into the left hemiscrotum.  We then  made an attempt to grasp the vas with a ring forceps through our initial  puncture site but this was not possible.  Likewise we continued to have  the vas isolated between our fingertips and made two other separate  puncture wounds, one to the superior medial aspect of the scrotum and  one to the superior lateral aspect of the scrotum and neither of these  was sufficient enough to deliver the vas.  We then elected to proceed  forth with inguinal exploration.  We made a superficial incision over  the superficial inguinal ring.  We carried it down using blunt  dissection until we got to the superficial inguinal ring and saw the  spermatic cord exiting it.  We then used our fingers to isolate the vas  deferens and then placed ring forceps around it here.  At this point we   then proceeded forth with standard vasectomy.   We used the needlepoint forceps to dissect out the vas from the  perivasal tissues.  Once an adequate segment of vas deferens was freed  up we then placed two hemostats over the vas and amputated a section of  the vas between the two forceps and then we tied both ends of the vas  deferens with 3-0 Vicryl suture.  The proximal end on the right side had  two ties placed on it.  Again, we closed over some dartos tissue over  the distal end of the vas thus compartmentalizing both proximal and  distal ends.  We then closed our inguinal incision in two layers.  The  deep layer was reapproximated with multiple simple interrupted inverted  sutures using 3-0 chromic and then the skin was closed with 4-0 Vicryl  in a subcuticular fashion.  We did instill 1% plain lidocaine in the  inguinal incision prior to closure to help with postoperative anesthesia  and then we closed all of our incisions with Dermabond.  This marked the  end of our procedure.  He awoke from anesthesia and was taken to the  recovery room in stable condition.  There were no complications.  Dr.  Annabell Howells was present and participated in all aspects of the case.     ______________________________  Dr Cori Razor. Annabell Howells, M.D.  Electronically Signed    DW/MEDQ  D:  05/24/2008  T:  05/24/2008  Job:  [id number]

## 2011-01-02 NOTE — Assessment & Plan Note (Signed)
[facility]                          [facility] OFFICE NOTE   [name] [name]                          MRN:          [id number]  DATE:06/21/2006                            DOB:          Aug 13, 1966    REASON FOR VISIT:  Hospital follow up.   HISTORY OF THE PRESENT ILLNESS:  [name] [name] is a 45 year old male who  reports that on June 11, 2006 he presented to the ED with left arm pain  described as a pressure sensation.  This started while eating dinner at a  restaurant.  He reported some diaphoresis with the above symptom.  This led  to him being seen in the emergency department.  He denied to me any chest  pain, although it is documented as such in the emergency department noted.  Nonetheless he had a thorough evaluation in the emergency department and was  noted to have an abnormal EKG suggestive of an inferior infarct.  They did  not have a previous EKG to compare.  The patient was discharged with  nonspecific chest pain and was advised to follow up with a primary care  Madysun Thall for further evaluation, i.e. stress test.  Since this episode the  patient denies any recurrence.   On further history taking the patient the patient revealed that earlier in  2000 he had complained of dizziness.  He was noted to have an abnormal EKG  and was recommended to have a stress test, which according to the patient  was within normal limits.   The patient denies any significant family history of heart disease.   PAST MEDICAL HISTORY:  None, except for a stress test in 2003.   A quick review of pertinent information from old records that are presents  during his visit today showed that he had a Cardiolite in 2003 done by Dr.  Mayford Knife at [facility].  It was noted that his Cardiolite was  significant for a filling defect in the inferior aspect of the heart  consistent with an area of small ischemia.  He had no inducible ischemia by  EKG  criteria.   MEDICATIONS:  None.   ALLERGIES:  PENICILLIN, ASPIRIN, DECONGESTANT AND ANTIHISTAMINES.   FAMILY HISTORY:  Father is alive with a history of diabetes.  Mother is  alive with a history of asthma.  He has one brother with a history of bone  cancer.  He has one sister who is alive and well.   SOCIAL HISTORY:  The patient works in Audiological scientist.  He is married with one  child.  He denies any tobacco use and drinks alcohol socially.   REVIEW OF SYSTEMS:  The review of systems is as per the HPI, otherwise is  unremarkable.   OBJECTIVE:  VITAL SIGNS:  Weight 154 pounds.  Temperature 98.3, pulse 60 and  blood pressure 90/60.  GENERAL APPEARANCE:  In general we have a pleasant male in no acute distress  __________  appropriately.  NECK:  The neck is supple.  No lymphadenopathy, carotid bruits or JVD.  No  thyromegaly.  LUNGS:  The  lungs are clear.  HEART:  The heart has a regular rate and rhythm.  Normal S1 and S2.  No  murmurs, gallops or rubs.  EXTREMITIES:  The extremities show no cyanosis, clubbing or edema.   IMPRESSION:  This is a 45 year old male presenting with atypical chest/arm  pain to the emergency department; negative for an acute coronary event.  He  was noted to have an abnormal electrocardiogram, which based on review of  the pertinent medical record may have been an old finding that was  previously evaluated with a Cardiolite in 2003.  The Cardiolite showed a  very small area of filling defect in the apical region of the heart, which  was consistent with a small area of ischemia.   PLAN:  1. I reviewed the above information with [name] [name] who states he was      under the impression that the stress test was normal.  I recommended      reevaluation by cardiology given his recent episode that led to the      emergency department visit.  2. We will stratify the patient's risk stress test by starting with a      lipid panel.  3. I will refer the patient back to  [facility] to be reassessed by      Dr. Mayford Knife; and, treatment with a beta blocker and aspirin as      precautions for review of the patient.  4. We will have him follow up after the above is done for reassessment.   The patient expressed understanding.    ______________________________  Leanne Chang, M.D.    LA/MedQ  DD: 06/21/2006  DT: 06/22/2006  Job #: [id number]

## 2011-02-26 ENCOUNTER — Other Ambulatory Visit: Payer: Self-pay | Admitting: Internal Medicine

## 2011-03-02 NOTE — Telephone Encounter (Signed)
10 tabs, 5 RF

## 2011-05-18 LAB — POCT HEMOGLOBIN-HEMACUE: Hemoglobin: 16.7

## 2011-07-30 ENCOUNTER — Ambulatory Visit (INDEPENDENT_AMBULATORY_CARE_PROVIDER_SITE_OTHER): Payer: Managed Care, Other (non HMO) | Admitting: Internal Medicine

## 2011-07-30 DIAGNOSIS — J40 Bronchitis, not specified as acute or chronic: Secondary | ICD-10-CM

## 2011-07-30 DIAGNOSIS — R52 Pain, unspecified: Secondary | ICD-10-CM

## 2011-07-30 DIAGNOSIS — J029 Acute pharyngitis, unspecified: Secondary | ICD-10-CM

## 2011-07-30 DIAGNOSIS — R509 Fever, unspecified: Secondary | ICD-10-CM

## 2011-07-30 DIAGNOSIS — R6883 Chills (without fever): Secondary | ICD-10-CM

## 2011-07-30 LAB — POCT RAPID STREP A (OFFICE): Rapid Strep A Screen: NEGATIVE

## 2011-07-30 LAB — POCT INFLUENZA A/B
Influenza A, POC: NEGATIVE
Influenza B, POC: NEGATIVE

## 2011-07-30 MED ORDER — ESOMEPRAZOLE MAGNESIUM 40 MG PO CPDR: 40.0000 mg | DELAYED_RELEASE_CAPSULE | Freq: Every day | ORAL | Status: DC

## 2011-07-30 MED ORDER — AZITHROMYCIN 250 MG PO TABS: ORAL_TABLET | ORAL | Status: AC

## 2011-07-30 NOTE — Patient Instructions (Signed)
Rest, fluids , tylenol For cough, take Mucinex DM twice a day as needed  FTake the antibiotic as prescribed ----> zithromax Call if no better in few days Call anytime if the symptoms are severe

## 2011-07-30 NOTE — Progress Notes (Signed)
  Subjective:    Patient ID: Timothy [name], male    DOB: 02/27/1966, 45 y.o.   MRN: [id number]  HPI Symptoms started 10 days ago, symptoms are getting worse for the last 2 days . Sinus and nose congestion, sore throat, cough with sputum production (green in color), some anterior chest pain with cough. 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 No history of asthma.    Objective:   Physical Exam  Constitutional: He is oriented to person, place, and time. He appears well-developed and well-nourished. No distress.       Nontoxic  HENT:  Head: Normocephalic and atraumatic.       Both tympanic membranes are slightly bulging, minimal redness, no discharge. Face symmetric, nontender to palpation. Nose is slightly congested. Throat without discharge or redness  Cardiovascular: Normal rate, regular rhythm and normal heart sounds.   No murmur heard. Pulmonary/Chest: Effort normal. No respiratory distress. He has no wheezes. He has no rales.       Few rhonchi, clear after coughing   Musculoskeletal: He exhibits no edema.  Neurological: He is alert and oriented to person, place, and time.  Skin: He is not diaphoretic.      Assessment &  Plan:  Bronchitis: Symptoms consistent with bronchitis, strep test and flu test negative. Allergies noted. See instructions

## 2011-08-07 ENCOUNTER — Telehealth: Payer: Self-pay

## 2011-08-07 MED ORDER — DOXYCYCLINE HYCLATE 100 MG PO TABS: 100.0000 mg | ORAL_TABLET | Freq: Two times a day (BID) | ORAL | Status: AC

## 2011-08-07 NOTE — Telephone Encounter (Signed)
Patient made aware of recommendations and Rx has been faxed      KP

## 2011-08-07 NOTE — Telephone Encounter (Signed)
Patient was seen 07/30/11 and given a Z-pak and he stated he is still sick, he said he is still coughing up discolored phlegm, runny nose and sinus pressure/drainage. Wants to know if he could get something else called in.  Please advise     KP

## 2011-08-07 NOTE — Telephone Encounter (Signed)
Call in doxycycline 100 mg 1 by mouth twice a day for 1 week. Office visit if not improving by next week

## 2011-08-25 ENCOUNTER — Ambulatory Visit: Payer: Managed Care, Other (non HMO) | Admitting: Internal Medicine

## 2011-08-25 DIAGNOSIS — Z0289 Encounter for other administrative examinations: Secondary | ICD-10-CM

## 2011-12-07 ENCOUNTER — Telehealth: Payer: Self-pay | Admitting: Internal Medicine

## 2011-12-07 MED ORDER — LEVOCETIRIZINE DIHYDROCHLORIDE 5 MG PO TABS: 5.0000 mg | ORAL_TABLET | Freq: Every evening | ORAL | Status: DC

## 2011-12-07 MED ORDER — ESOMEPRAZOLE MAGNESIUM 40 MG PO CPDR: 40.0000 mg | DELAYED_RELEASE_CAPSULE | Freq: Every day | ORAL | Status: DC

## 2011-12-07 NOTE — Telephone Encounter (Signed)
Refill: Levocetirizi tab 5mg  Nexium gra 40mg  dr  Request 90 day supply

## 2011-12-07 NOTE — Telephone Encounter (Signed)
Refill done.  

## 2012-01-15 ENCOUNTER — Other Ambulatory Visit: Payer: Self-pay | Admitting: Internal Medicine

## 2012-01-15 NOTE — Telephone Encounter (Signed)
Ok to refill 

## 2012-01-21 NOTE — Telephone Encounter (Signed)
Refill done.  

## 2012-01-21 NOTE — Telephone Encounter (Signed)
Ok 10, 0 RF

## 2012-01-21 NOTE — Telephone Encounter (Signed)
Ok to refill 

## 2012-01-21 NOTE — Telephone Encounter (Signed)
Received a 2nd request today for Cialis 20mg  tablets Take one tablet by mouth every day if needed Last fill 3.22.13 Last ov 12.13.12

## 2012-01-27 ENCOUNTER — Ambulatory Visit (INDEPENDENT_AMBULATORY_CARE_PROVIDER_SITE_OTHER): Payer: Managed Care, Other (non HMO) | Admitting: Internal Medicine

## 2012-01-27 VITALS — BP 130/78 | HR 76 | Temp 98.1°F | Ht 71.0 in | Wt 248.0 lb

## 2012-01-27 DIAGNOSIS — R079 Chest pain, unspecified: Secondary | ICD-10-CM

## 2012-01-27 DIAGNOSIS — Z0000 Encounter for general adult medical examination without abnormal findings: Secondary | ICD-10-CM | POA: Insufficient documentation

## 2012-01-27 DIAGNOSIS — J309 Allergic rhinitis, unspecified: Secondary | ICD-10-CM

## 2012-01-27 DIAGNOSIS — F528 Other sexual dysfunction not due to a substance or known physiological condition: Secondary | ICD-10-CM

## 2012-01-27 MED ORDER — TADALAFIL 2.5 MG PO TABS: 1.0000 | ORAL_TABLET | Freq: Every day | ORAL | Status: DC

## 2012-01-27 NOTE — Assessment & Plan Note (Addendum)
ED started ~ 2007 when he went through a divorce, since then he needs Cialis sometimes, no side effects. Did not respond well to Viagra.  extensice discussion about this issue, concern b/c needs to take medication; advise pt that unless we find a treatable cause for ED, is likely he will need prn meds for a while. CV exam is normal, will check a testosterone level. Change to daily cialis which was suggested by the patient. I see no contraindication

## 2012-01-27 NOTE — Assessment & Plan Note (Signed)
Failed a # of antihistaminics in the past, not  well controlled w/ current meds  Refer to allergist

## 2012-01-27 NOTE — Assessment & Plan Note (Signed)
(-)   w/u before, xanax helped before: RF

## 2012-01-27 NOTE — Assessment & Plan Note (Addendum)
Td 2005 counseled about diet-exercise labs  PSA for baseline  in case he needs HRT

## 2012-01-27 NOTE — Progress Notes (Signed)
  Subjective:    Patient ID: Timothy [name], male    DOB: [birth year], 46 y.o.   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"  CP 04/09/2008 ---> stress test (nl) and ECHO (Nl), GB u/s neg; had a previous eval by cards as well  shingles 03-2009  Past Surgical History:  surgery for undescended testicle --R--  Vasectomy approximately 2009  Family History:  Asthma--M  Diabetes -- F, S  MI--no  bone cancer- brother  breast ca-- S  colon ca--no  prostate ca--no  Social History:  Occupation: Airline pilot  Divorced,  1 son 97 y/o  Regular exercise--no regular, active , biking, snow sports  diet-- regular  no tobacco  ETOH-- 2 or 3 times a week,   Review of Systems Allergies not well-controlled, good compliance with current meds. GERD symptoms well controlled, as long as he takes PPIs Still has occasional chest pain, see assessment and plan. No shortness of breath No nausea, vomiting, diarrhea. No blood in the stools. No dysuria gross what diarrhea. No anxiety or depression. Has several questions about ED, see assessment and plan    Objective:   Physical Exam General -- alert, well-developed, and overweight appearing. No apparent distress.  Neck --no no thyromegaly, normal carotid pulses Lungs -- normal respiratory effort, no intercostal retractions, no accessory muscle use, and normal breath sounds.   Heart-- normal rate, regular rhythm, no murmur, and no gallop.   Normal femoral pulses. Abdomen--soft, non-tender, no distention, no masses, no HSM, no guarding, and no rigidity.  No bruit Extremities-- no pretibial edema bilaterally  Neurologic-- alert & oriented X3 and strength normal in all extremities. Psych-- Cognition and judgment appear intact. Alert and cooperative with normal attention span and  concentration.  not anxious appearing and not depressed appearing.       Assessment & Plan:

## 2012-01-28 ENCOUNTER — Ambulatory Visit: Payer: Managed Care, Other (non HMO)

## 2012-01-28 ENCOUNTER — Encounter: Payer: Self-pay | Admitting: Internal Medicine

## 2012-01-28 DIAGNOSIS — E291 Testicular hypofunction: Secondary | ICD-10-CM

## 2012-01-28 LAB — CBC WITH DIFFERENTIAL/PLATELET
Basophils Absolute: 0 10*3/uL (ref 0.0–0.1)
Eosinophils Relative: 1.5 % (ref 0.0–5.0)
HCT: 45.3 % (ref 39.0–52.0)
Hemoglobin: 14.9 g/dL (ref 13.0–17.0)
Lymphs Abs: 2.2 10*3/uL (ref 0.7–4.0)
MCV: 90.2 fl (ref 78.0–100.0)
Monocytes Relative: 5.3 % (ref 3.0–12.0)
Neutro Abs: 5.4 10*3/uL (ref 1.4–7.7)
RDW: 13.5 % (ref 11.5–14.6)

## 2012-01-28 LAB — COMPREHENSIVE METABOLIC PANEL
ALT: 21 U/L (ref 0–53)
Alkaline Phosphatase: 48 U/L (ref 39–117)
CO2: 28 mEq/L (ref 19–32)
Creatinine, Ser: 0.9 mg/dL (ref 0.4–1.5)
GFR: 95.24 mL/min (ref >60.00)
Total Bilirubin: 1.1 mg/dL (ref 0.3–1.2)

## 2012-01-28 LAB — TESTOSTERONE, FREE, TOTAL, SHBG: Testosterone: 212.98 ng/dL — ABNORMAL LOW (ref 300–890)

## 2012-01-28 LAB — LIPID PANEL
LDL Cholesterol: 74 mg/dL (ref 0–99)
VLDL: 33.2 mg/dL (ref 0.0–40.0)

## 2012-01-28 LAB — TSH: TSH: 0.85 u[IU]/mL (ref 0.35–5.50)

## 2012-01-29 LAB — PROLACTIN: Prolactin: 4.8 ng/mL (ref 2.1–17.1)

## 2012-02-01 ENCOUNTER — Encounter: Payer: Self-pay | Admitting: *Deleted

## 2012-04-02 ENCOUNTER — Other Ambulatory Visit: Payer: Self-pay | Admitting: Internal Medicine

## 2012-04-05 NOTE — Telephone Encounter (Signed)
Spoke with pharmacy & they confirmed pt still has refills left on file.  

## 2012-05-19 ENCOUNTER — Other Ambulatory Visit: Payer: Self-pay | Admitting: Internal Medicine

## 2012-05-19 NOTE — Telephone Encounter (Signed)
Refill Levocetirizine  5 MG last wrt 4.22.13, last ov 6.12.13 V70

## 2012-05-19 NOTE — Telephone Encounter (Signed)
Refill done.  

## 2012-05-20 MED ORDER — LEVOCETIRIZINE DIHYDROCHLORIDE 5 MG PO TABS: 5.0000 mg | ORAL_TABLET | Freq: Every evening | ORAL | Status: DC

## 2012-05-20 NOTE — Telephone Encounter (Signed)
Refill: Cialis tab 20 mg. 90 day supply

## 2012-05-20 NOTE — Telephone Encounter (Signed)
Refill done.  

## 2012-06-01 ENCOUNTER — Encounter: Payer: Self-pay | Admitting: Internal Medicine

## 2012-06-01 ENCOUNTER — Ambulatory Visit (INDEPENDENT_AMBULATORY_CARE_PROVIDER_SITE_OTHER): Payer: Managed Care, Other (non HMO) | Admitting: Internal Medicine

## 2012-06-01 VITALS — BP 114/78 | HR 74 | Temp 97.6°F | Wt 254.6 lb

## 2012-06-01 DIAGNOSIS — J069 Acute upper respiratory infection, unspecified: Secondary | ICD-10-CM

## 2012-06-01 DIAGNOSIS — J209 Acute bronchitis, unspecified: Secondary | ICD-10-CM

## 2012-06-01 MED ORDER — AZITHROMYCIN 250 MG PO TABS: ORAL_TABLET | ORAL | Status: DC

## 2012-06-01 NOTE — Patient Instructions (Addendum)
Plain Mucinex for thick secretions ;force NON dairy fluids . Use a Neti pot daily as needed for sinus congestion; going from open side to congested side . Nasal cleansing in the shower as discussed. Make sure that all residual soap is removed to prevent irritation. Fluticasone 1 spray in each nostril twice a day as needed. Use the "crossover" technique as discussed. Plain Xyzal 160 daily as needed for itchy eyes & sneezing.

## 2012-06-01 NOTE — Progress Notes (Signed)
  Subjective:    Patient ID: Timothy [name], male    DOB: [birth year], 46 y.o.   MRN: [id number]  HPI Symptoms began 2.5 days ago as malaise and scratchy throat. These were followed by postnasal drainage and cough with yellow sputum. Tylenol has been of some benefit. He experienced a chill last night. He's had frontal headache & dental pain.    Review of Systems He denies facial pain, purulence nasal secretions, otic pain or discharge. He's had no associated shortness of breath or wheezing with the cough. He denies fever or sweats.     Objective:   Physical Exam General appearance:good health ;well nourished; no acute distress or increased work of breathing is present.  No  lymphadenopathy about the head, neck, or axilla noted.   Eyes: No conjunctival inflammation or lid edema is present.  Ears:  External ear exam shows no significant lesions or deformities.  Otoscopic examination reveals clear canals, tympanic membranes are intact bilaterally without bulging, retraction, inflammation or discharge.  Nose:  External nasal examination shows no deformity or inflammation. Nasal mucosa are pink and moist without lesions or exudates. No septal dislocation or deviation.No obstruction to airflow.   Oral exam: Dental hygiene is good; lips and gums are healthy appearing.There is no oropharyngeal erythema or exudate noted.   Neck:  No deformities,  masses, or tenderness noted.    Heart:  Normal rate and regular rhythm. S1 and S2 normal without gallop, murmur, click, rub or other extra sounds.   Lungs:Chest clear to auscultation; no wheezes, rhonchi,rales ,or rubs present.No increased work of breathing.    Extremities:  No cyanosis, edema, or clubbing  noted    Skin: Warm & dry .            Assessment & Plan:  #1 acute bronchitis w/o bronchospasm #2 URI Plan: See orders and recommendations

## 2012-06-11 ENCOUNTER — Other Ambulatory Visit: Payer: Self-pay | Admitting: Internal Medicine

## 2012-06-13 NOTE — Telephone Encounter (Signed)
Pharmacy did not receive refill sent over on 10.3.13. Refill re-sent.

## 2012-09-01 ENCOUNTER — Ambulatory Visit (INDEPENDENT_AMBULATORY_CARE_PROVIDER_SITE_OTHER): Payer: Managed Care, Other (non HMO) | Admitting: Family Medicine

## 2012-09-01 ENCOUNTER — Encounter: Payer: Self-pay | Admitting: Family Medicine

## 2012-09-01 VITALS — BP 106/68 | HR 65 | Temp 98.0°F | Wt 258.0 lb

## 2012-09-01 DIAGNOSIS — J329 Chronic sinusitis, unspecified: Secondary | ICD-10-CM

## 2012-09-01 MED ORDER — CLARITHROMYCIN ER 500 MG PO TB24: 1000.0000 mg | ORAL_TABLET | Freq: Every day | ORAL | Status: AC

## 2012-09-01 NOTE — Progress Notes (Signed)
  Subjective:     Timothy [name] is a 47 y.o. male who presents for evaluation of sinus pain. Symptoms include: congestion, cough, nasal congestion and sinus pressure. Onset of symptoms was 2 days ago. Symptoms have been gradually worsening since that time. Past history is significant for no history of pneumonia or bronchitis. Patient is a non-smoker.  The following portions of the patient's history were reviewed and updated as appropriate: allergies, current medications, past family history, past medical history, past social history, past surgical history and problem list.  Review of Systems Pertinent items are noted in HPI.   Objective:    BP 106/68  Pulse 65  Temp 98 F (36.7 C) (Oral)  Wt 258 lb (117.028 kg)  SpO2 97% General appearance: alert, cooperative, appears stated age and no distress Ears: normal TM's and external ear canals both ears Nose: Nares normal. Septum midline. Mucosa normal. No drainage or sinus tenderness., clear discharge, moderate congestion, no sinus tenderness Throat: lips, mucosa, and tongue normal; teeth and gums normal Neck: no adenopathy, no carotid bruit, no JVD, supple, symmetrical, trachea midline and thyroid not enlarged, symmetric, no tenderness/mass/nodules Lungs: clear to auscultation bilaterally Heart: S1, S2 normal        Assessment:    Acute viral sinusitis.    Plan:    Neti pot recommended. Instructions given. Nasal steroids per medication orders. Antihistamines per medication orders. abx if no better or worsens over next several days

## 2012-09-01 NOTE — Patient Instructions (Signed)

## 2012-09-05 ENCOUNTER — Other Ambulatory Visit: Payer: Self-pay | Admitting: *Deleted

## 2012-09-05 NOTE — Telephone Encounter (Signed)
What medicines does he needs refill ?

## 2012-09-05 NOTE — Telephone Encounter (Signed)
cialis 

## 2012-09-05 NOTE — Telephone Encounter (Signed)
Last filled 01-27-12 #30 6, last OV 09-01-12. Pt is request a call back once done..Please advise

## 2012-09-06 MED ORDER — TADALAFIL 2.5 MG PO TABS: 1.0000 | ORAL_TABLET | Freq: Every day | ORAL | Status: DC

## 2012-09-06 NOTE — Telephone Encounter (Signed)
Rx sent, Pt aware. 

## 2012-09-06 NOTE — Telephone Encounter (Signed)
Ok daily cialis, #90, 2 RF

## 2012-10-01 ENCOUNTER — Other Ambulatory Visit: Payer: Self-pay

## 2012-10-25 ENCOUNTER — Ambulatory Visit (INDEPENDENT_AMBULATORY_CARE_PROVIDER_SITE_OTHER): Payer: Managed Care, Other (non HMO) | Admitting: Internal Medicine

## 2012-10-25 ENCOUNTER — Encounter: Payer: Self-pay | Admitting: Internal Medicine

## 2012-10-25 VITALS — BP 118/78 | HR 62 | Temp 98.1°F | Wt 242.0 lb

## 2012-10-25 DIAGNOSIS — M549 Dorsalgia, unspecified: Secondary | ICD-10-CM

## 2012-10-25 MED ORDER — PREDNISONE 10 MG PO TABS: ORAL_TABLET | ORAL | Status: DC

## 2012-10-25 MED ORDER — HYDROCODONE-ACETAMINOPHEN 5-325 MG PO TABS: 1.0000 | ORAL_TABLET | Freq: Three times a day (TID) | ORAL | Status: DC | PRN

## 2012-10-25 MED ORDER — CYCLOBENZAPRINE HCL 10 MG PO TABS: 10.0000 mg | ORAL_TABLET | Freq: Every evening | ORAL | Status: DC | PRN

## 2012-10-25 NOTE — Progress Notes (Signed)
  Subjective:    Patient ID: Timothy [name], male    DOB: 05-02-1966, 47 y.o.   MRN: [id number]  HPI Acute visit Symptoms started 4 weeks ago, having back pain, right-sided. Symptoms gradually increasing. Initially Tylenol and Advil helped but not so much lately. No radiation. Pain described as a catch or spasm. Denies any recent injury or heavy lifting. Denies any tingling in the toes. No problems with bladder or bowels.  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   Extremities-- no pretibial edema bilaterally  Neurologic-- alert & oriented X3 , DTRs and motor exam normal. Straight leg test negative Psych-- Cognition and judgment appear intact. Alert and cooperative with normal attention span and concentration.  not anxious appearing and not depressed appearing.       Assessment & Plan:  Acute back pain, no red flags symptoms, see instructions.

## 2012-10-25 NOTE — Patient Instructions (Addendum)
Rest Heat 2-3 times a day Take prednisone as prescribed For pain: Tylenol  500 mg OTC 2 tabs a day every 8 hours as needed If pain severe, take hydrocodone instead (has already tylenol on it), will cause drowsiness Flexeril, a muscle relaxant at night, as needed. Will cause drowsiness  Call if no better in 10 day, call if worse

## 2013-02-27 ENCOUNTER — Ambulatory Visit (INDEPENDENT_AMBULATORY_CARE_PROVIDER_SITE_OTHER): Payer: Managed Care, Other (non HMO) | Admitting: Internal Medicine

## 2013-02-27 ENCOUNTER — Encounter: Payer: Self-pay | Admitting: Internal Medicine

## 2013-02-27 VITALS — BP 130/75 | HR 78 | Temp 98.5°F | Wt 242.8 lb

## 2013-02-27 DIAGNOSIS — M542 Cervicalgia: Secondary | ICD-10-CM

## 2013-02-27 MED ORDER — CYCLOBENZAPRINE HCL 10 MG PO TABS: 10.0000 mg | ORAL_TABLET | Freq: Every evening | ORAL | Status: DC | PRN

## 2013-02-27 MED ORDER — PREDNISONE 10 MG PO TABS: ORAL_TABLET | ORAL | Status: DC

## 2013-02-27 NOTE — Patient Instructions (Addendum)
Prednisone as prescribed Flexeril at night, is a muscle relaxant, may cause drowsiness. Tylenol  500 mg OTC 2 tabs a day every 8 hours as needed for pain Heating pad Call if not better in 10-14 days, call anytime if symptoms severe or you have any tingling in the arms or legs. Schedule a physical at your convenience

## 2013-02-27 NOTE — Progress Notes (Signed)
  Subjective:    Patient ID: Timothy [name], male    DOB: 06-25-1966, 47 y.o.   MRN: [id number]  HPI Acute visit Symptoms started 3 days ago immediately after he was pulling a [place] which was on the sand : Developed pain at the upper right side of the back with some radiation to the nuchal area, he is able to turn his head left or right but the pain definitely increased with neck flexion. Has taken only Tylenol for pain.     Past Medical History  Diagnosis Date  . Allergic rhinitis   . Hypertriglyceridemia   . Erectile dysfunction   . GERD (gastroesophageal reflux disease)   . Undescended right testicle     s/p surgery as a child---us 11-11 "absent right testcile"  . Chest pain 04-09-2008    stress test normal and echo (NI), GB US negative had a previous eval by cards as well  . Shingles 03-2009    Past Surgical History  Procedure Laterality Date  . Other surgical history      undescended testicle surgery, right  . Vasectomy  2009    Review of Systems Denies fever or chills. No rash. No upper or lower extremities  paresthesias. No bladder or bowel continence No gait disorders     Objective:   Physical Exam BP 130/75  Pulse 78  Temp(Src) 98.5 F (36.9 C) (Oral)  Wt 242 lb 12.8 oz (110.133 kg)  BMI 33.88 kg/m2  SpO2 97%  General -- alert, well-developed, NAD Neck -- Not tender to palpation at the cervical spine or  proximal T spine . Neck is full range of motion except when he tries to flex it, he develops mild pain and has to stop. Extremities-- no pretibial edema bilaterally Neurologic-- alert & oriented X3 ; Motor and DTRs symmetric throughout. Gait normal.. Psych-- Cognition and judgment appear intact. Alert and cooperative with normal attention span and concentration.  not anxious appearing and not depressed appearing.      Assessment & Plan:   Neck pain, Acute neck pain, no radicular features, will treat conservatively as a sprain of the trapezoid however if he  is not improving soon , will need further eval. See instruction.

## 2013-05-08 ENCOUNTER — Other Ambulatory Visit: Payer: Self-pay | Admitting: Internal Medicine

## 2013-05-08 NOTE — Telephone Encounter (Signed)
rx refilled per protocol. DJR  

## 2013-05-11 ENCOUNTER — Other Ambulatory Visit: Payer: Self-pay | Admitting: Internal Medicine

## 2013-05-11 NOTE — Telephone Encounter (Signed)
rx refilled per protocol. DJR  

## 2013-06-22 ENCOUNTER — Other Ambulatory Visit: Payer: Self-pay

## 2013-07-24 ENCOUNTER — Ambulatory Visit (INDEPENDENT_AMBULATORY_CARE_PROVIDER_SITE_OTHER): Payer: Managed Care, Other (non HMO) | Admitting: Physician Assistant

## 2013-07-24 ENCOUNTER — Encounter: Payer: Self-pay | Admitting: Physician Assistant

## 2013-07-24 ENCOUNTER — Other Ambulatory Visit: Payer: Self-pay | Admitting: Physician Assistant

## 2013-07-24 ENCOUNTER — Ambulatory Visit (HOSPITAL_BASED_OUTPATIENT_CLINIC_OR_DEPARTMENT_OTHER)
Admission: RE | Admit: 2013-07-24 | Discharge: 2013-07-24 | Disposition: A | Discharge status: Home / Self Care | Payer: Managed Care, Other (non HMO) | Source: Ambulatory Visit | Attending: Physician Assistant | Admitting: Physician Assistant

## 2013-07-24 VITALS — BP 110/78 | HR 71 | Temp 98.3°F | Ht 71.0 in | Wt 243.0 lb

## 2013-07-24 DIAGNOSIS — M62838 Other muscle spasm: Secondary | ICD-10-CM

## 2013-07-24 DIAGNOSIS — R0781 Pleurodynia: Secondary | ICD-10-CM | POA: Insufficient documentation

## 2013-07-24 DIAGNOSIS — R079 Chest pain, unspecified: Secondary | ICD-10-CM

## 2013-07-24 MED ORDER — CYCLOBENZAPRINE HCL 10 MG PO TABS: 10.0000 mg | ORAL_TABLET | Freq: Every evening | ORAL | Status: DC | PRN
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
## 2013-07-24 NOTE — Patient Instructions (Signed)
Please continue with Ibuprofen or Aleve for pain.  Apply a topical medicine to sore areas -- Salon Pas or Aspercreme.  Stand in the shower and let the warm water hit your back and shoulder.  Go downstairs for imaging.  I will call you with your results.

## 2013-07-24 NOTE — Progress Notes (Signed)
Pre visit review using our clinic review tool, if applicable. No additional management support is needed unless otherwise documented below in the visit note. 

## 2013-07-24 NOTE — Assessment & Plan Note (Signed)
Giving trauma, will obtain DG ribs.  Continue ibuprofen for pain.  Avoid overexertion.  Apply topical Aspercreme.  Apply moist heat to affected area.  Rx flexeril for muscle spasm.

## 2013-08-31 ENCOUNTER — Other Ambulatory Visit: Payer: Self-pay | Admitting: Internal Medicine

## 2013-08-31 NOTE — Telephone Encounter (Signed)
Nexium refilled per protocol. JG//CMA

## 2014-01-09 ENCOUNTER — Ambulatory Visit (INDEPENDENT_AMBULATORY_CARE_PROVIDER_SITE_OTHER): Payer: Managed Care, Other (non HMO) | Admitting: Internal Medicine

## 2014-01-09 ENCOUNTER — Encounter: Payer: Self-pay | Admitting: Internal Medicine

## 2014-01-09 VITALS — BP 130/84 | HR 69 | Temp 98.3°F | Wt 245.0 lb

## 2014-01-09 DIAGNOSIS — L259 Unspecified contact dermatitis, unspecified cause: Secondary | ICD-10-CM

## 2014-01-09 DIAGNOSIS — L309 Dermatitis, unspecified: Secondary | ICD-10-CM

## 2014-01-09 MED ORDER — HYDROCORTISONE 2.5 % EX CREA: TOPICAL_CREAM | Freq: Two times a day (BID) | CUTANEOUS | Status: DC

## 2014-01-09 NOTE — Patient Instructions (Signed)
Use the cream twice a day x 2 weeks  Take a B12 complex tablet a day along with a multivitamin OTC   Call if not improving soon   Next visit is for a physical exam in 1 month,  fasting Please make an appointment

## 2014-01-09 NOTE — Progress Notes (Signed)
   Subjective:    Patient ID: Timothy [name], male    DOB: 08-Aug-1966, 48 y.o.   MRN: [id number]  DOS:  01/09/2014 Type of  Visit: acute  Symptoms started 4 months ago with dry, chapped , burning lips mostly the lower one. He is not doing anything different or  taking any new medications.   ROS No recent dental problems. No GERD symptoms as long as he takes PPIs No blisters.  Past Medical History  Diagnosis Date  . Allergic rhinitis   . Hypertriglyceridemia   . Erectile dysfunction   . GERD (gastroesophageal reflux disease)   . Undescended right testicle     s/p surgery as a child---us 11-11 "absent right testcile"  . Chest pain 04-09-2008    stress test normal and echo (NI), GB US negative had a previous eval by cards as well  . Shingles 03-2009    Past Surgical History  Procedure Laterality Date  . Other surgical history      undescended testicle surgery, right  . Vasectomy  2009    History   Social History  . Marital Status: Married    Spouse Name: N/A    Number of Children: 1  . Years of Education: N/A   Occupational History  . controller    Social History Main Topics  . Smoking status: Never Smoker   . Smokeless tobacco: Not on file  . Alcohol Use: 1.8 oz/week    3 Cans of beer per week  . Drug Use: No  . Sexual Activity: Not on file   Other Topics Concern  . Not on file   Social History Narrative  . No narrative on file        Medication List       This list is accurate as of: 01/09/14  1:04 PM.  Always use your most recent med list.               CIALIS 2.5 MG Tabs  Generic drug:  Tadalafil  TAKE 1 TABLET (2.5 MG TOTAL) BY MOUTH DAILY.     hydrocortisone 2.5 % cream  Apply topically 2 (two) times daily.     NEXIUM 40 MG capsule  Generic drug:  esomeprazole  TAKE 1 CAPSULE DAILY BEFOREBREAKFAST           Objective:   Physical Exam BP 130/84  Pulse 69  Temp(Src) 98.3 F (36.8 C)  Wt 245 lb (111.131 kg)  SpO2 100%  General --  alert, well-developed, NAD.   HEENT--   Oral cavity normal to inspection Lips: + Dryness and scaliness lower> upper, no blisters. Corners of the mouth al;so affected  Neurologic--  alert & oriented X3. Speech normal, gait normal, strength normal in all extremities.  Psych-- Cognition and judgment appear intact. Cooperative with normal attention span and concentration. No anxious or depressed appearing.      Assessment & Plan:    Dermatitis, New problem Hydrocortisone 2.5%, B12 supplements, reassess in one month.

## 2014-02-14 ENCOUNTER — Other Ambulatory Visit: Payer: Self-pay | Admitting: Internal Medicine

## 2014-02-14 NOTE — Telephone Encounter (Signed)
Pt is wanting today.

## 2014-02-15 ENCOUNTER — Other Ambulatory Visit: Payer: Self-pay | Admitting: *Deleted

## 2014-02-15 MED ORDER — TADALAFIL 2.5 MG PO TABS: ORAL_TABLET | ORAL | Status: DC

## 2014-02-15 NOTE — Telephone Encounter (Signed)
rx sent

## 2014-03-07 ENCOUNTER — Encounter: Payer: Self-pay | Admitting: Internal Medicine

## 2014-03-07 ENCOUNTER — Ambulatory Visit (INDEPENDENT_AMBULATORY_CARE_PROVIDER_SITE_OTHER): Payer: Managed Care, Other (non HMO) | Admitting: Internal Medicine

## 2014-03-07 VITALS — BP 119/78 | HR 64 | Temp 98.3°F | Ht 71.2 in | Wt 251.0 lb

## 2014-03-07 DIAGNOSIS — Z0000 Encounter for general adult medical examination without abnormal findings: Secondary | ICD-10-CM

## 2014-03-07 DIAGNOSIS — F528 Other sexual dysfunction not due to a substance or known physiological condition: Secondary | ICD-10-CM

## 2014-03-07 DIAGNOSIS — G4733 Obstructive sleep apnea (adult) (pediatric): Secondary | ICD-10-CM

## 2014-03-07 MED ORDER — KETOCONAZOLE 2 % EX CREA
TOPICAL_CREAM | CUTANEOUS | Status: DC
Start: 2014-03-07 — End: 2014-09-27

## 2014-03-07 MED ORDER — TADALAFIL 5 MG PO TABS: 5.0000 mg | ORAL_TABLET | Freq: Every day | ORAL | Status: DC

## 2014-03-07 NOTE — Patient Instructions (Signed)
Come back fasting for labs: CMP, FLP, CBC, TSH, PSA ----------dx v70  See the eye doctor  If the lips  are not improving gradually --- pease call  Use the foot cream twice a day for 2 weeks  Next visit in one year.

## 2014-03-07 NOTE — Assessment & Plan Note (Signed)
Sleep study was negative, currently feeling well, no fatigue

## 2014-03-07 NOTE — Assessment & Plan Note (Signed)
Td 2005, declined one today counseled about diet-exercise labs  PSA 2013 was normal (1.76, most people his age have a PSA < 1, will recheck , DRE normal today)

## 2014-03-07 NOTE — Assessment & Plan Note (Signed)
Still having issues with erectile dysfunction. Previously his testosterone was normal. Patient request increase cialis, will do to 5 mg daily.

## 2014-03-07 NOTE — Progress Notes (Signed)
Subjective:    Patient ID: Timothy [name], male    DOB: 08/04/1966, 48 y.o.   MRN: [id number]  DOS:  03/07/2014 Type of visit - description: CPX History: Several other issues discussed , see a/p   ROS Denies chest pain or difficulty breathing No nausea, vomiting, diarrhea or blood in the stools. GERD symptoms well controlled with Nexium No cough, sputum production, wheezing. No anxiety or depression.   Past Medical History  Diagnosis Date  . Allergic rhinitis   . Hypertriglyceridemia   . Erectile dysfunction   . GERD (gastroesophageal reflux disease)   . Undescended right testicle     s/p surgery as a child---us 11-11 "absent right testcile"  . Chest pain 04-09-2008    stress test normal and echo (NI), GB US negative had a previous eval by cards as well  . Shingles 03-2009    Past Surgical History  Procedure Laterality Date  . Other surgical history      undescended testicle surgery, right  . Vasectomy  2009    History   Social History  . Marital Status: Married    Spouse Name: N/A    Number of Children: 1  . Years of Education: N/A   Occupational History  . controller    Social History Main Topics  . Smoking status: Never Smoker   . Smokeless tobacco: Never Used  . Alcohol Use: 1.8 oz/week    3 Cans of beer per week     Comment: socially   . Drug Use: No  . Sexual Activity: Not on file   Other Topics Concern  . Not on file   Social History Narrative   Child 1988     Family History  Problem Relation Age of Onset  . Asthma Mother   . Diabetes Father   . Cancer Brother     bone   . Breast cancer Sister   . Allergies Mother   . Lung cancer Mother   . Colon cancer Neg Hx   . Prostate cancer Neg Hx   . CAD Neg Hx        Medication List       This list is accurate as of: 03/07/14 11:59 PM.  Always use your most recent med list.               hydrocortisone 2.5 % cream  Apply topically 2 (two) times daily.     ketoconazole 2 % cream    Commonly known as:  NIZORAL  Apply twice a day For 2 weeks     NEXIUM 40 MG capsule  Generic drug:  esomeprazole  TAKE 1 CAPSULE DAILY BEFOREBREAKFAST     tadalafil 5 MG tablet  Commonly known as:  CIALIS  Take 1 tablet (5 mg total) by mouth daily.           Objective:   Physical Exam  HENT:  Head:     BP 119/78  Pulse 64  Temp(Src) 98.3 F (36.8 C)  Ht 5' 11.2" (1.808 m)  Wt 251 lb (113.853 kg)  BMI 34.83 kg/m2  SpO2 99% General -- alert, well-developed, NAD.  Neck --no thyromegaly   HEENT-- Not pale.  Lips-- skin s/t dry but looks better than before  Lungs -- normal respiratory effort, no intercostal retractions, no accessory muscle use, and normal breath sounds.  Heart-- normal rate, regular rhythm, no murmur.  Abdomen-- Not distended, good bowel sounds,soft, non-tender.  Rectal-- No external abnormalities noted. Normal sphincter tone.  No rectal masses or tenderness. Stool brown  Prostate--Prostate gland firm and smooth, no enlargement, nodularity, tenderness, mass, asymmetry or induration. Extremities-- no pretibial edema bilaterally; L foot normal. R foot: No maceration between the toes, no onychomycosis but the distal plantar skin is slightly scaly  Neurologic--  alert & oriented X3. Speech normal, gait appropriate for age, strength symmetric and appropriate for age.   Psych-- Cognition and judgment appear intact. Cooperative with normal attention span and concentration. No anxious or depressed appearing.        Assessment & Plan:   Cheilitis, improving, continue with prn topical steroids   Mild fungal dermatitis, right foot: s/p OTC still has residual abnormality, see exam-------> Ketoconazole twice a day for 2 weeks  Mass, near the left eye, needs to see ophthalmology, reports he will make his own appointment

## 2014-05-02 ENCOUNTER — Other Ambulatory Visit: Payer: Self-pay | Admitting: Internal Medicine

## 2014-05-23 ENCOUNTER — Other Ambulatory Visit: Payer: Self-pay

## 2014-05-23 ENCOUNTER — Encounter: Payer: Self-pay | Admitting: Internal Medicine

## 2014-05-23 ENCOUNTER — Ambulatory Visit (INDEPENDENT_AMBULATORY_CARE_PROVIDER_SITE_OTHER): Payer: Managed Care, Other (non HMO) | Admitting: Internal Medicine

## 2014-05-23 ENCOUNTER — Telehealth: Payer: Self-pay | Admitting: Internal Medicine

## 2014-05-23 VITALS — BP 128/68 | HR 61 | Temp 98.1°F | Wt 250.2 lb

## 2014-05-23 DIAGNOSIS — M5442 Lumbago with sciatica, left side: Secondary | ICD-10-CM

## 2014-05-23 MED ORDER — METHOCARBAMOL 750 MG PO TABS: 750.0000 mg | ORAL_TABLET | Freq: Three times a day (TID) | ORAL | Status: DC | PRN

## 2014-05-23 MED ORDER — HYDROCODONE-ACETAMINOPHEN 5-325 MG PO TABS: 1.0000 | ORAL_TABLET | Freq: Three times a day (TID) | ORAL | Status: DC | PRN

## 2014-05-23 MED ORDER — PREDNISONE 10 MG PO TABS: ORAL_TABLET | ORAL | Status: DC

## 2014-05-23 NOTE — Telephone Encounter (Signed)
Caller name: Bernie  Relation to pt: self  Call back number: [phone] [facility]acy: [id number] [place], [address] [phone]   Reason for call: pt requesting a refill methocarbamol (ROBAXIN) 750 MG tablet predniSONE (DELTASONE) 10 MG tablet requesting medication please sent to retail pharmacy.

## 2014-05-23 NOTE — Telephone Encounter (Signed)
[facility].

## 2014-05-23 NOTE — Patient Instructions (Signed)
Rest, cold compresses Prednisone as prescribed for few days Tylenol  500 mg OTC 2 tabs a day every 8 hours as needed for pain  hydrocodone for moderate pain, will cause drowsiness. You can also take Robaxin, a muscle relaxant, will cause drowsiness.  If not gradually improving in the next 10 days to 2 weeks, let  me know. If symptoms severe --call anytime  You will need a referral  If no better

## 2014-05-23 NOTE — Progress Notes (Signed)
Pre visit review using our clinic review tool, if applicable. No additional management support is needed unless otherwise documented below in the visit note. 

## 2014-05-23 NOTE — Progress Notes (Signed)
Subjective:    Patient ID: Timothy [name], male    DOB: 09-17-1965, 48 y.o.   MRN: [id number]  DOS:  05/23/2014 Type of visit - description : acute Interval history,  Back pain is started a week ago,suddenly while sitting in a chair, pain is  bilateral on the lower aspect of the back, some radiation to the L side of the hip Pain is gradually getting worse,  difficult to get  comfortable at night. Sx increased by walking, twisting his torso or bending forward. Has been taking Tylenol and ibuprofen with minimal relief. Pain has been as bad as 7/10   ROS  denies fever or chills No rash in the back, abdomen or  leg. No bladder or bowel incontinence. No dysuria, gross hematuria and difficulty urinating No recent fall or injury.  Past Medical History  Diagnosis Date  . Allergic rhinitis   . Hypertriglyceridemia   . Erectile dysfunction   . GERD (gastroesophageal reflux disease)   . Undescended right testicle     s/p surgery as a child---us 11-11 "absent right testcile"  . Chest pain 04-09-2008    stress test normal and echo (NI), GB US negative had a previous eval by cards as well  . Shingles 03-2009    Past Surgical History  Procedure Laterality Date  . Other surgical history      undescended testicle surgery, right  . Vasectomy  2009    History   Social History  . Marital Status: Married    Spouse Name: N/A    Number of Children: 1  . Years of Education: N/A   Occupational History  . controller    Social History Main Topics  . Smoking status: Never Smoker   . Smokeless tobacco: Never Used  . Alcohol Use: 1.8 oz/week    3 Cans of beer per week     Comment: socially   . Drug Use: No  . Sexual Activity: Not on file   Other Topics Concern  . Not on file   Social History Narrative   Child 1988        Medication List       This list is accurate as of: 05/23/14 11:59 PM.  Always use your most recent med list.               HYDROcodone-acetaminophen 5-325  MG per tablet  Commonly known as:  NORCO/VICODIN  Take 1-2 tablets by mouth every 8 (eight) hours as needed.     hydrocortisone 2.5 % cream  Apply topically 2 (two) times daily.     ketoconazole 2 % cream  Commonly known as:  NIZORAL  Apply twice a day For 2 weeks     methocarbamol 750 MG tablet  Commonly known as:  ROBAXIN  Take 1 tablet (750 mg total) by mouth every 8 (eight) hours as needed for muscle spasms.     NEXIUM 40 MG capsule  Generic drug:  esomeprazole  TAKE 1 CAPSULE DAILY BEFOREBREAKFAST     esomeprazole 40 MG capsule  Commonly known as:  NEXIUM  TAKE 1 CAPSULE DAILY BEFOREBREAKFAST     predniSONE 10 MG tablet  Commonly known as:  DELTASONE  4 tablets x 2 days, 3 tabs x 2 days, 2 tabs x 2 days, 1 tab x 2 days     tadalafil 5 MG tablet  Commonly known as:  CIALIS  Take 1 tablet (5 mg total) by mouth daily.  Objective:   Physical Exam BP 128/68  Pulse 61  Temp(Src) 98.1 F (36.7 C) (Oral)  Wt 250 lb 4 oz (113.513 kg)  SpO2 98% General -- alert, well-developed, NAD.   Abdomen-- Not distended, good bowel sounds,soft, non-tender. No rash Extremities-- no pretibial edema bilaterally  Neurologic--  alert & oriented X3. Speech normal, gait slt antalgic, posture not antalgic, strength symmetric and appropriate for age.  DTRs symmetric except for slt decreased L ankle jerk Question of + L straight leg test Psych-- Cognition and judgment appear intact. Cooperative with normal attention span and concentration. No anxious or depressed appearing.        Assessment & Plan:  Back pain, Acute back pain with features consistent with a radiculopathy, L4?. Plan: Prednisone, Robaxin, Tylenol, hydrocodone If not improving soon will call for a  referral, may need an MRI See instructions

## 2014-05-25 ENCOUNTER — Telehealth: Payer: Self-pay | Admitting: Internal Medicine

## 2014-05-25 NOTE — Telephone Encounter (Signed)
Spoke with [facility], clarified order on prednisone for Pt.

## 2014-05-25 NOTE — Telephone Encounter (Signed)
° °  Caller name: [facility] Relation to pt: other  Call back number: [phone]  Reason for call:    In need of clarification regarding direction of RX  predniSONE (DELTASONE) 10 MG tablet dr  Reference# [id number]

## 2014-06-01 ENCOUNTER — Other Ambulatory Visit: Payer: Self-pay

## 2014-06-27 ENCOUNTER — Encounter: Payer: Self-pay | Admitting: Physician Assistant

## 2014-06-27 ENCOUNTER — Ambulatory Visit (HOSPITAL_BASED_OUTPATIENT_CLINIC_OR_DEPARTMENT_OTHER)
Admission: RE | Admit: 2014-06-27 | Discharge: 2014-06-27 | Disposition: A | Discharge status: Home / Self Care | Payer: Managed Care, Other (non HMO) | Source: Ambulatory Visit | Attending: Physician Assistant | Admitting: Physician Assistant

## 2014-06-27 ENCOUNTER — Ambulatory Visit (INDEPENDENT_AMBULATORY_CARE_PROVIDER_SITE_OTHER): Payer: Managed Care, Other (non HMO) | Admitting: Physician Assistant

## 2014-06-27 ENCOUNTER — Telehealth: Payer: Self-pay | Admitting: Physician Assistant

## 2014-06-27 VITALS — BP 118/75 | HR 75 | Temp 98.6°F | Resp 16 | Ht 71.0 in | Wt 252.0 lb

## 2014-06-27 DIAGNOSIS — M549 Dorsalgia, unspecified: Secondary | ICD-10-CM | POA: Insufficient documentation

## 2014-06-27 DIAGNOSIS — M545 Low back pain: Secondary | ICD-10-CM | POA: Insufficient documentation

## 2014-06-27 DIAGNOSIS — M5416 Radiculopathy, lumbar region: Secondary | ICD-10-CM

## 2014-06-27 MED ORDER — KETOROLAC TROMETHAMINE 30 MG/ML IJ SOLN
30.0000 mg | Freq: Once | INTRAMUSCULAR | Status: AC
  Administered 2014-06-27: 30 mg via INTRAMUSCULAR

## 2014-06-27 MED ORDER — METHYLPREDNISOLONE ACETATE 80 MG/ML IJ SUSP
40.0000 mg | Freq: Once | INTRAMUSCULAR | Status: AC
  Administered 2014-06-27: 40 mg via INTRAMUSCULAR

## 2014-06-27 MED ORDER — HYDROCODONE-ACETAMINOPHEN 5-325 MG PO TABS: 1.0000 | ORAL_TABLET | Freq: Three times a day (TID) | ORAL | Status: DC | PRN

## 2014-06-27 MED ORDER — CYCLOBENZAPRINE HCL 10 MG PO TABS: 10.0000 mg | ORAL_TABLET | Freq: Three times a day (TID) | ORAL | Status: DC | PRN

## 2014-06-27 NOTE — Progress Notes (Signed)
Pre visit review using our clinic review tool, if applicable. No additional management support is needed unless otherwise documented below in the visit note/SLS  

## 2014-06-27 NOTE — Assessment & Plan Note (Signed)
Im Depo Medrol given.  Toradol given as well at small dose.  Percocet given to begin tomorrow.  Rx Flexeril.  Will obtain X-ray of lumbar spine today.  Will proceed with MRI and referral. Alarm signs/symptoms discussed with patient.  Patient aware of when to proceed to ER if indicated.

## 2014-06-27 NOTE — Addendum Note (Signed)
Addended by: Rockwell Germany on: 06/27/2014 06:44 PM   Modules accepted: Orders

## 2014-06-27 NOTE — Progress Notes (Signed)
Patient presents to clinic today c/o continued low back pain, mostly R-sided that is radiating into RLE.  Patient was seen in early October for similar complaints.  Was given course of prednisone, muscle relaxant and pain medication.  Patient states pain improved some but has persisted and is now worsening.  Denies change to bowel or bladder habits.  Denies saddle anesthesia.  Denies trauma or injury. Endorses a long-standing history of back issues but has never had imaging or seen a specialist.  Past Medical History  Diagnosis Date  . Allergic rhinitis   . Hypertriglyceridemia   . Erectile dysfunction   . GERD (gastroesophageal reflux disease)   . Undescended right testicle     s/p surgery as a child---us 11-11 "absent right testcile"  . Chest pain 04-09-2008    stress test normal and echo (NI), GB US negative had a previous eval by cards as well  . Shingles 03-2009    Current Outpatient Prescriptions on File Prior to Visit  Medication Sig Dispense Refill  . esomeprazole (NEXIUM) 40 MG capsule TAKE 1 CAPSULE DAILY BEFOREBREAKFAST 90 capsule 1  . hydrocortisone 2.5 % cream Apply topically 2 (two) times daily. 30 g 0  . ketoconazole (NIZORAL) 2 % cream Apply twice a day For 2 weeks 60 g 0  . tadalafil (CIALIS) 5 MG tablet Take 1 tablet (5 mg total) by mouth daily. 90 tablet 1   No current facility-administered medications on file prior to visit.    Allergies  Allergen Reactions  . Aspirin     Rash   . Decongest     rash  . Penicillins     ? Rash as child    Family History  Problem Relation Age of Onset  . Asthma Mother   . Diabetes Father   . Cancer Brother     bone   . Breast cancer Sister   . Allergies Mother   . Lung cancer Mother   . Colon cancer Neg Hx   . Prostate cancer Neg Hx   . CAD Neg Hx     History   Social History  . Marital Status: Married    Spouse Name: N/A    Number of Children: 1  . Years of Education: N/A   Occupational History  .  controller    Social History Main Topics  . Smoking status: Never Smoker   . Smokeless tobacco: Never Used  . Alcohol Use: 1.8 oz/week    3 Cans of beer per week     Comment: socially   . Drug Use: No  . Sexual Activity: None   Other Topics Concern  . None   Social History Narrative   Child 1988   Review of Systems - See HPI.  All other ROS are negative.  BP 118/75 mmHg  Pulse 75  Temp(Src) 98.6 F (37 C) (Oral)  Resp 16  Ht 5\' 11"  (1.803 m)  Wt 252 lb (114.306 kg)  BMI 35.16 kg/m2  SpO2 99%  Physical Exam  Constitutional: He is oriented to person, place, and time and well-developed, well-nourished, and in no distress.  HENT:  Head: Normocephalic and atraumatic.  Cardiovascular: Normal rate, regular rhythm, normal heart sounds and intact distal pulses.   Pulmonary/Chest: Effort normal and breath sounds normal. No respiratory distress. He has no wheezes. He has no rales. He exhibits no tenderness.  Musculoskeletal:       Cervical back: Normal.       Thoracic back: Normal.  Lumbar back: He exhibits pain and spasm. He exhibits normal range of motion, no tenderness and no bony tenderness.  + Straight leg raise test of R leg.  Neurological: He is alert and oriented to person, place, and time.  Skin: Skin is warm and dry. No rash noted.  Psychiatric: Affect normal.  Vitals reviewed.  Assessment/Plan: Right lumbar radiculopathy Im Depo Medrol given.  Toradol given as well at small dose.  Percocet given to begin tomorrow.  Rx Flexeril.  Will obtain X-ray of lumbar spine today.  Will proceed with MRI and referral. Alarm signs/symptoms discussed with patient.  Patient aware of when to proceed to ER if indicated.

## 2014-06-27 NOTE — Telephone Encounter (Signed)
LMOM with contact name and number [for return call, if needed] RE: results and further provider instructions/SLS  

## 2014-06-27 NOTE — Telephone Encounter (Signed)
X-ray reveals mild osteoarthritic changes.  Will proceed with MRI and referral to Neurosurgery.

## 2014-06-27 NOTE — Patient Instructions (Signed)
Please take medications as directed.  Avoid heavy lifting and over exertion.  Apply Aspercreme to lower back to help with pain.  Go straight downstairs for x-ray. I will call with your results.  We will then proceed with MRI and referral to specialist.  If at any time you have difficulty with bowel or bladder habits (retention or incontinence) in addition to the back pain, I want you to proceed to the ER as this is sign of severe nerve compression and is an emergency.  Follow-up will be scheduled based on results.

## 2014-07-05 ENCOUNTER — Telehealth: Payer: Self-pay | Admitting: Internal Medicine

## 2014-07-05 NOTE — Telephone Encounter (Signed)
Caller name: Emily Relation to pt: self Call back number:  [phone] Pharmacy:  Reason for call:   Patient states that his insurance will not cover for an MRI and wants to know what else he should do? He saw Einar Pheasant for this

## 2014-07-05 NOTE — Telephone Encounter (Signed)
FYI. Please advise.

## 2014-07-06 NOTE — Telephone Encounter (Signed)
Matthew Folks [phone]  Returned Janett Billow call

## 2014-07-06 NOTE — Telephone Encounter (Signed)
Reviewed denial letter. Insurance will not cover until he has followed our treatment plan for 4-6 weeks. Please reassess patient's symptoms after recent treatment. If pain is improving, continue supportive measures and follow-up in 2 more weeks so we can document progress and see if MRI is still indicated.  If pain unchanged, then I will have to reorder the MRI and try to get his insurance to approve it.  If symptoms are worsening, or if there is weakness of lower extremities or numbness in groin, please let me know so that a STAT imaging can be obtained.

## 2014-07-06 NOTE — Telephone Encounter (Signed)
Called pt and lmovm for pt to return call.

## 2014-07-06 NOTE — Telephone Encounter (Signed)
Spoke with pt. States that he is feeling much better. However, back pain is still a daily nagging pain. Pt states will wait 2 weeks but will cal lback in if pain worsens.

## 2014-07-25 ENCOUNTER — Ambulatory Visit: Payer: Managed Care, Other (non HMO) | Admitting: Physician Assistant

## 2014-07-26 ENCOUNTER — Other Ambulatory Visit: Payer: Self-pay

## 2014-07-27 ENCOUNTER — Encounter: Payer: Self-pay | Admitting: Internal Medicine

## 2014-07-27 ENCOUNTER — Ambulatory Visit (INDEPENDENT_AMBULATORY_CARE_PROVIDER_SITE_OTHER): Payer: Managed Care, Other (non HMO) | Admitting: Internal Medicine

## 2014-07-27 VITALS — BP 124/80 | HR 95 | Temp 98.5°F | Wt 257.4 lb

## 2014-07-27 DIAGNOSIS — M5442 Lumbago with sciatica, left side: Secondary | ICD-10-CM

## 2014-07-27 NOTE — Progress Notes (Signed)
Pre visit review using our clinic review tool, if applicable. No additional management support is needed unless otherwise documented below in the visit note. 

## 2014-07-27 NOTE — Progress Notes (Signed)
Subjective:    Patient ID: Timothy [name], male    DOB: 17-Nov-1965, 48 y.o.   MRN: [id number]  DOS:  07/27/2014 Type of visit - description : acute Interval history: Back pain is now better but not completely gone, still has pain when he tries to be physically active,  he is avoiding physical activity. Still has some paresthesias at the left leg. He was seen a month ago for the second time, go to a Depo-Medrol, x-ray was negative. MRI was recommended but not approved by his insurance. Takes occasional pain medication without significant relief  ROS Denies fever or chills No bladder or bowel incontinence   Past Medical History  Diagnosis Date  . Allergic rhinitis   . Hypertriglyceridemia   . Erectile dysfunction   . GERD (gastroesophageal reflux disease)   . Undescended right testicle     s/p surgery as a child---us 11-11 "absent right testcile"  . Chest pain 04-09-2008    stress test normal and echo (NI), GB US negative had a previous eval by cards as well  . Shingles 03-2009    Past Surgical History  Procedure Laterality Date  . Other surgical history      undescended testicle surgery, right  . Vasectomy  2009    History   Social History  . Marital Status: Married    Spouse Name: N/A    Number of Children: 1  . Years of Education: N/A   Occupational History  . controller    Social History Main Topics  . Smoking status: Never Smoker   . Smokeless tobacco: Never Used  . Alcohol Use: 1.8 oz/week    3 Cans of beer per week     Comment: socially   . Drug Use: No  . Sexual Activity: Not on file   Other Topics Concern  . Not on file   Social History Narrative   Child 1988        Medication List       This list is accurate as of: 07/27/14 11:59 PM.  Always use your most recent med list.               cyclobenzaprine 10 MG tablet  Commonly known as:  FLEXERIL  Take 1 tablet (10 mg total) by mouth 3 (three) times daily as needed for muscle spasms.     esomeprazole 40 MG capsule  Commonly known as:  NEXIUM  TAKE 1 CAPSULE DAILY BEFOREBREAKFAST     HYDROcodone-acetaminophen 5-325 MG per tablet  Commonly known as:  NORCO/VICODIN  Take 1-2 tablets by mouth every 8 (eight) hours as needed.     hydrocortisone 2.5 % cream  Apply topically 2 (two) times daily.     ketoconazole 2 % cream  Commonly known as:  NIZORAL  Apply twice a day For 2 weeks     tadalafil 5 MG tablet  Commonly known as:  CIALIS  Take 1 tablet (5 mg total) by mouth daily.           Objective:   Physical Exam BP 124/80 mmHg  Pulse 95  Temp(Src) 98.5 F (36.9 C) (Oral)  Wt 257 lb 6 oz (116.745 kg)  SpO2 98% General -- alert, well-developed, NAD.   Extremities-- no pretibial edema bilaterally  Neurologic--  alert & oriented X3. Speech normal, gait- posture not antalgic. Strength symmetric and appropriate for age.  DTRs symmetric. ? Of + straight leg test on the L Psych-- Cognition and judgment appear intact. Cooperative with  normal attention span and concentration. No anxious or depressed appearing.       Assessment & Plan:

## 2014-07-27 NOTE — Patient Instructions (Addendum)
Heating pad, then stretching   [place] with information about home physical therapy for back pain: [email or web address]   Next visit by 02-2015 for a physical

## 2014-07-28 NOTE — Assessment & Plan Note (Signed)
Back pain, 2 months history of low back pain with question of left-sided radiculopathy, not improving with conservative treatment. X-ray negative Plan: Self physical therapy, continue with as needed use of pain medication and Flexeril, orthopedic referral

## 2014-08-17 DIAGNOSIS — R1031 Right lower quadrant pain: Secondary | ICD-10-CM

## 2014-08-17 HISTORY — DX: Right lower quadrant pain: R10.31

## 2014-09-26 ENCOUNTER — Other Ambulatory Visit: Payer: Self-pay | Admitting: Internal Medicine

## 2014-09-26 NOTE — Telephone Encounter (Signed)
Okay to refill Nizoral 60 g. Okay to refill Cialis, I see that he is taking 5 mg daily, the right dose is 2.5 mg daily. Please discuss that with the patient and send the right dose 2.5 mg daily

## 2014-09-26 NOTE — Telephone Encounter (Signed)
Pt is requesting refill on Nizoral 2 % cream and Cialis  Last OV: 07/27/2014 Last Fill on Nizoral cream: 03/07/2014: 60 g 0RF Last Fill on Cialis: 03/07/2014 # 90 1RF  Please advise.

## 2014-09-27 ENCOUNTER — Other Ambulatory Visit: Payer: Self-pay

## 2014-09-27 MED ORDER — KETOCONAZOLE 2 % EX CREA: TOPICAL_CREAM | CUTANEOUS | Status: DC

## 2014-09-27 MED ORDER — TADALAFIL 5 MG PO TABS: 2.5000 mg | ORAL_TABLET | Freq: Every day | ORAL | Status: DC

## 2014-10-03 ENCOUNTER — Other Ambulatory Visit: Payer: Self-pay | Admitting: Internal Medicine

## 2014-10-15 ENCOUNTER — Other Ambulatory Visit: Payer: Self-pay | Admitting: Internal Medicine

## 2014-10-18 ENCOUNTER — Other Ambulatory Visit: Payer: Self-pay | Admitting: Internal Medicine

## 2014-10-18 NOTE — Telephone Encounter (Signed)
[facility] # 45 tablets and 0RFs, which is a 3 month supply. Pt should have 2 months remaining if taking as prescribed which is 1/2 tablet daily. He should have enough until 12/26/2014 if taking correctly.

## 2014-10-18 NOTE — Telephone Encounter (Signed)
Caller name:[name], Timothy Relation to pt: self  Call back number: [phone] Pharmacy: [id number] - [place], [place] [phone] (Phone) [phone] (Fax)       Reason for call:  Pt requesting a 90 day supply of tadalafil (CIALIS) 5 MG tablet

## 2014-10-18 NOTE — Telephone Encounter (Signed)
Excuse last message, not sure when/why Cialis was decreased to 1/2 tablet daily. Sending to Dr. Larose Kells for clarification if Pt should be taking 1/2 tablet or 1 tablet as needed.

## 2014-10-19 NOTE — Telephone Encounter (Signed)
The right away to take it is Cialis 5 mg half tablet daily.

## 2014-10-22 ENCOUNTER — Telehealth: Payer: Self-pay | Admitting: *Deleted

## 2014-10-22 NOTE — Telephone Encounter (Signed)
Quantity limits prior authorization initiated for Cialis. Awaiting determination. JG//CMA

## 2014-10-23 ENCOUNTER — Other Ambulatory Visit: Payer: Self-pay | Admitting: Internal Medicine

## 2014-10-23 NOTE — Telephone Encounter (Signed)
Patient requesting 90 day supply.

## 2014-11-01 NOTE — Telephone Encounter (Signed)
PA denied, appeal started. JG//CMA

## 2014-11-12 NOTE — Telephone Encounter (Signed)
[facility] Needs to know if dx BPH also  Fax [phone]  Needs this by end of day tomorrow

## 2014-11-13 NOTE — Telephone Encounter (Signed)
Info faxed. JG//CMA

## 2014-11-14 NOTE — Telephone Encounter (Signed)
PA approved effective 11/13/2014 through 11/12/2017. JG//CMA

## 2014-11-20 ENCOUNTER — Other Ambulatory Visit: Payer: Self-pay | Admitting: Internal Medicine

## 2014-11-20 ENCOUNTER — Other Ambulatory Visit: Payer: Self-pay

## 2014-12-06 ENCOUNTER — Encounter: Payer: Self-pay | Admitting: Medical

## 2014-12-06 ENCOUNTER — Ambulatory Visit (INDEPENDENT_AMBULATORY_CARE_PROVIDER_SITE_OTHER): Payer: BLUE CROSS/BLUE SHIELD | Admitting: Medical

## 2014-12-06 VITALS — BP 123/84 | HR 62 | Temp 98.7°F | Ht 71.0 in | Wt 246.6 lb

## 2014-12-06 DIAGNOSIS — T7840XA Allergy, unspecified, initial encounter: Secondary | ICD-10-CM | POA: Diagnosis not present

## 2014-12-06 MED ORDER — SULFAMETHOXAZOLE-TRIMETHOPRIM 800-160 MG PO TABS: 1.0000 | ORAL_TABLET | Freq: Two times a day (BID) | ORAL | Status: DC

## 2014-12-06 MED ORDER — METHYLPREDNISOLONE ACETATE 40 MG/ML IJ SUSP
40.0000 mg | Freq: Once | INTRAMUSCULAR | Status: AC
  Administered 2014-12-06: 40 mg via INTRAMUSCULAR

## 2014-12-06 MED ORDER — HYDROXYZINE HCL 25 MG PO TABS: 25.0000 mg | ORAL_TABLET | Freq: Three times a day (TID) | ORAL | Status: DC | PRN

## 2014-12-06 MED ORDER — PREDNISONE 20 MG PO TABS: ORAL_TABLET | ORAL | Status: DC

## 2014-12-06 NOTE — Progress Notes (Signed)
Subjective:    Patient ID: Timothy [name], male    DOB: 19-Nov-1965, 49 y.o.   MRN: [id number]  HPI  Pt has rash intially rt side medial forearm. Some on left side forearm. Areas do itch.He thinks poison ivy. Started 3-4 days ago. Pt does work in yard a lot all the time. Some spread to lt arm.   Review of Systems  Constitutional: Negative for fever, chills and fatigue.  Respiratory: Negative for cough, choking, chest tightness and wheezing.   Cardiovascular: Negative for chest pain and palpitations.  Musculoskeletal: Negative for back pain.  Skin: Positive for rash.       Ithces a lot.  Neurological: Negative for dizziness.  Hematological: Negative for adenopathy. Does not bruise/bleed easily.  Psychiatric/Behavioral: Negative for confusion, decreased concentration and agitation.   Past Medical History  Diagnosis Date  . Allergic rhinitis   . Hypertriglyceridemia   . Erectile dysfunction   . GERD (gastroesophageal reflux disease)   . Undescended right testicle     s/p surgery as a child---us 11-11 "absent right testcile"  . Chest pain 04-09-2008    stress test normal and echo (NI), GB US negative had a previous eval by cards as well  . Shingles 03-2009    History   Social History  . Marital Status: Married    Spouse Name: N/A  . Number of Children: 1  . Years of Education: N/A   Occupational History  . controller    Social History Main Topics  . Smoking status: Never Smoker   . Smokeless tobacco: Never Used  . Alcohol Use: 1.8 oz/week    3 Cans of beer per week     Comment: socially   . Drug Use: No  . Sexual Activity: Not on file   Other Topics Concern  . Not on file   Social History Narrative   Child 1988    Past Surgical History  Procedure Laterality Date  . Other surgical history      undescended testicle surgery, right  . Vasectomy  2009    Family History  Problem Relation Age of Onset  . Asthma Mother   . Diabetes Father   . Cancer Brother    bone   . Breast cancer Sister   . Allergies Mother   . Lung cancer Mother   . Colon cancer Neg Hx   . Prostate cancer Neg Hx   . CAD Neg Hx     Allergies  Allergen Reactions  . Aspirin     Rash   . Decongest     rash  . Penicillins     ? Rash as child    Current Outpatient Prescriptions on File Prior to Visit  Medication Sig Dispense Refill  . cyclobenzaprine (FLEXERIL) 10 MG tablet Take 1 tablet (10 mg total) by mouth 3 (three) times daily as needed for muscle spasms. 60 tablet 0  . esomeprazole (NEXIUM) 40 MG capsule TAKE 1 CAPSULE DAILY BEFOREBREAKFAST 90 capsule 1  . hydrocortisone 2.5 % cream Apply topically 2 (two) times daily. 30 g 0  . ketoconazole (NIZORAL) 2 % cream Apply twice a day as needed 60 g 1  . tadalafil (CIALIS) 5 MG tablet Take 1/2 (2.5 mg) tablet every day. 30 tablet 0   No current facility-administered medications on file prior to visit.    BP 123/84 mmHg  Pulse 62  Temp(Src) 98.7 F (37.1 C) (Oral)  Ht 5\' 11"  (1.803 m)  Wt 246 lb 9.6 oz (  111.857 kg)  BMI 34.41 kg/m2  SpO2 100%      Objective:   Physical Exam  General- No acute distress. Pleasant patient. Neck- Full range of motion, no jvd Lungs- Clear, even and unlabored. Heart- regular rate and rhythm. Neurologic- CNII- XII grossly intact. Rt distal forearm- 3 cm x 2 cm area mild pink scatterd vesicles.  Both arms- scattered tiny vesicle.       Assessment & Plan:

## 2014-12-06 NOTE — Patient Instructions (Addendum)
Allergic reaction Suspect poison ivy contact. Depo medrol 40 mg im. Prednsione 20 mg 3 times daily x 3 days. Hydroxyzine for the itching.   Rt forearm may have early indication of secondary infection from itching. If more redness, any dc or crusting then start bactrim ds. Otherwise not to start.     Follow up in 7 days (any persisting symptoms) or as needed  Canceled the hydroxyzine. Pt pharmacy called.Pt aware not to take since he adamant allergies to various antihistamines?

## 2014-12-06 NOTE — Assessment & Plan Note (Signed)
Suspect poison ivy contact. Depo medrol 40 mg im. Prednsione 20 mg 3 times daily x 3 days. Hydroxyzine for the itching.   Rt forearm may have early indication of secondary infection from itching. If more redness, any dc or crusting then start bactrim ds. Otherwise not to start.

## 2014-12-06 NOTE — Progress Notes (Signed)
Pre visit review using our clinic review tool, if applicable. No additional management support is needed unless otherwise documented below in the visit note. 

## 2014-12-21 ENCOUNTER — Encounter: Payer: Self-pay | Admitting: Internal Medicine

## 2014-12-21 ENCOUNTER — Ambulatory Visit (INDEPENDENT_AMBULATORY_CARE_PROVIDER_SITE_OTHER): Payer: BLUE CROSS/BLUE SHIELD | Admitting: Internal Medicine

## 2014-12-21 VITALS — BP 128/88 | HR 68 | Temp 97.9°F | Ht 71.0 in | Wt 245.4 lb

## 2014-12-21 DIAGNOSIS — R350 Frequency of micturition: Secondary | ICD-10-CM | POA: Diagnosis not present

## 2014-12-21 DIAGNOSIS — N390 Urinary tract infection, site not specified: Secondary | ICD-10-CM | POA: Diagnosis not present

## 2014-12-21 LAB — POCT URINALYSIS DIPSTICK
Bilirubin, UA: NEGATIVE
GLUCOSE UA: NEGATIVE
Ketones, UA: NEGATIVE
NITRITE UA: NEGATIVE
Spec Grav, UA: 1.025
UROBILINOGEN UA: 1
pH, UA: 6

## 2014-12-21 LAB — CBC WITH DIFFERENTIAL/PLATELET
BASOS ABS: 0.1 10*3/uL (ref 0.0–0.1)
Basophils Relative: 1 % (ref 0–1)
EOS PCT: 3 % (ref 0–5)
Eosinophils Absolute: 0.3 10*3/uL (ref 0.0–0.7)
HCT: 45.4 % (ref 39.0–52.0)
Hemoglobin: 15.4 g/dL (ref 13.0–17.0)
LYMPHS PCT: 26 % (ref 12–46)
Lymphs Abs: 2.2 10*3/uL (ref 0.7–4.0)
MCH: 29.5 pg (ref 26.0–34.0)
MCHC: 33.9 g/dL (ref 30.0–36.0)
MCV: 87 fL (ref 78.0–100.0)
MONO ABS: 0.7 10*3/uL (ref 0.1–1.0)
MPV: 9.5 fL (ref 8.6–12.4)
Monocytes Relative: 8 % (ref 3–12)
Neutro Abs: 5.2 10*3/uL (ref 1.7–7.7)
Neutrophils Relative %: 62 % (ref 43–77)
PLATELETS: 236 10*3/uL (ref 150–400)
RBC: 5.22 MIL/uL (ref 4.22–5.81)
RDW: 13.3 % (ref 11.5–15.5)
WBC: 8.4 10*3/uL (ref 4.0–10.5)

## 2014-12-21 LAB — URINALYSIS, ROUTINE W REFLEX MICROSCOPIC
Bilirubin Urine: NEGATIVE
Ketones, ur: NEGATIVE
Nitrite: NEGATIVE
Specific Gravity, Urine: 1.02 (ref 1.000–1.030)
TOTAL PROTEIN, URINE-UPE24: NEGATIVE
Urine Glucose: NEGATIVE
Urobilinogen, UA: 1 (ref 0.0–1.0)
pH: 6.5 (ref 5.0–8.0)

## 2014-12-21 LAB — COMPREHENSIVE METABOLIC PANEL
ALK PHOS: 54 U/L (ref 39–117)
ALT: 17 U/L (ref 0–53)
AST: 14 U/L (ref 0–37)
Albumin: 4.3 g/dL (ref 3.5–5.2)
BILIRUBIN TOTAL: 0.7 mg/dL (ref 0.2–1.2)
BUN: 14 mg/dL (ref 6–23)
CALCIUM: 9 mg/dL (ref 8.4–10.5)
CO2: 26 mEq/L (ref 19–32)
Chloride: 102 mEq/L (ref 96–112)
Creat: 0.94 mg/dL (ref 0.50–1.35)
Glucose, Bld: 90 mg/dL (ref 70–99)
POTASSIUM: 4.2 meq/L (ref 3.5–5.3)
SODIUM: 138 meq/L (ref 135–145)
Total Protein: 6.7 g/dL (ref 6.0–8.3)

## 2014-12-21 MED ORDER — CIPROFLOXACIN HCL 500 MG PO TABS: 500.0000 mg | ORAL_TABLET | Freq: Two times a day (BID) | ORAL | Status: DC

## 2014-12-21 NOTE — Progress Notes (Signed)
Subjective:    Patient ID: Timothy [name], male    DOB: 08/22/1965, 49 y.o.   MRN: [id number]  DOS:  12/21/2014 Type of visit - description : acute Interval history: Approximately 5 days ago started with dysuria and urinary urgency as well as pressure and the suprapubic area. He took OTC Azo-Standard which help temporarily. Symptoms came back 2 days ago. Denies any unsafe sexual practices. Urine looks cloudy/smelly   Review of Systems Denies fever chills No nausea, vomiting, diarrhea. No flank pain. No gross hematuria, difficulty urinating. No penile discharge or a rash  Past Medical History  Diagnosis Date  . Allergic rhinitis   . Hypertriglyceridemia   . Erectile dysfunction   . GERD (gastroesophageal reflux disease)   . Undescended right testicle     s/p surgery as a child---us 11-11 "absent right testcile"  . Chest pain 04-09-2008    stress test normal and echo (NI), GB US negative had a previous eval by cards as well  . Shingles 03-2009    Past Surgical History  Procedure Laterality Date  . Other surgical history      undescended testicle surgery, right  . Vasectomy  2009    History   Social History  . Marital Status: Married    Spouse Name: N/A  . Number of Children: 1  . Years of Education: N/A   Occupational History  . controller    Social History Main Topics  . Smoking status: Never Smoker   . Smokeless tobacco: Never Used  . Alcohol Use: 1.8 oz/week    3 Cans of beer per week     Comment: socially   . Drug Use: No  . Sexual Activity: Not on file   Other Topics Concern  . Not on file   Social History Narrative   Child 68   Divorced, has a steady girlfriend        Medication List       This list is accurate as of: 12/21/14 11:59 PM.  Always use your most recent med list.               ciprofloxacin 500 MG tablet  Commonly known as:  CIPRO  Take 1 tablet (500 mg total) by mouth 2 (two) times daily.     cyclobenzaprine 10 MG tablet    Commonly known as:  FLEXERIL  Take 1 tablet (10 mg total) by mouth 3 (three) times daily as needed for muscle spasms.     esomeprazole 40 MG capsule  Commonly known as:  NEXIUM  TAKE 1 CAPSULE DAILY BEFOREBREAKFAST     hydrocortisone 2.5 % cream  Apply topically 2 (two) times daily.     ketoconazole 2 % cream  Commonly known as:  NIZORAL  Apply twice a day as needed     tadalafil 5 MG tablet  Commonly known as:  CIALIS  Take 1/2 (2.5 mg) tablet every day.           Objective:   Physical Exam BP 128/88 mmHg  Pulse 68  Temp(Src) 97.9 F (36.6 C) (Oral)  Ht 5\' 11"  (1.803 m)  Wt 245 lb 6 oz (111.301 kg)  BMI 34.24 kg/m2  SpO2 98%  General:   Well developed, well nourished . NAD.  HEENT:  Normocephalic . Face symmetric, atraumatic Abdomen:  Not distended, soft, non-tender. No rebound or rigidity. No mass,organomegaly; no CVA tenderness Rectal:  External abnormalities: none. Normal sphincter tone. No rectal masses or tenderness.  Stool brown  Prostate: Prostate gland firm and smooth, no enlargement, nodularity, tenderness, mass, asymmetry or induration.  GU: Normal left testicle, right testicle is not found Skin: Not pale. Not jaundice Neurologic:  alert & oriented X3.  Speech normal, gait appropriate for age and unassisted Psych--  Cognition and judgment appear intact.  Cooperative with normal attention span and concentration.  Behavior appropriate. No anxious or depressed appearing.      Assessment & Plan:

## 2014-12-21 NOTE — Progress Notes (Signed)
Pre visit review using our clinic review tool, if applicable. No additional management support is needed unless otherwise documented below in the visit note. 

## 2014-12-21 NOTE — Assessment & Plan Note (Signed)
Patient presents with symptoms consistent with a UTI, Udip supportive of the diagnosis. On further questioning, this is the third UTI in his lifetime. Also he has a history of undescended testicle, he had surgery on the right groin area when he was a child. A scrotal ultrasound was done in 2011, the right testicle was not seen. Plan: Labs including a PSA UA urine culture Cipro Urology referral, further workup For recurrent UTIs.

## 2014-12-21 NOTE — Patient Instructions (Signed)
Take antibiotics as prescribed Drink plenty of fluids Please come back in 4 months, fasting for a physical exam

## 2014-12-22 LAB — PSA: PSA: 9.12 ng/mL — AB (ref <=4.00)

## 2014-12-24 LAB — URINE CULTURE: Colony Count: >=100000

## 2015-01-18 ENCOUNTER — Other Ambulatory Visit: Payer: Self-pay | Admitting: Internal Medicine

## 2015-02-19 ENCOUNTER — Encounter: Payer: Self-pay | Admitting: Internal Medicine

## 2015-03-07 ENCOUNTER — Telehealth: Payer: Self-pay

## 2015-03-07 NOTE — Telephone Encounter (Signed)
-----   Message from Colon Branch, MD sent at 03/06/2015  4:55 PM EDT ----- Regarding: Patient has seen the email I sent, send a letter Marlou Sa,  I saw you last month having problems with urinary infections.  I sent you to see a urologist, I hope you did go, if you didn't please let us know, will help you schedule that visit.  JP

## 2015-03-07 NOTE — Telephone Encounter (Signed)
Letter printed and mailed to Pt.  

## 2015-04-29 ENCOUNTER — Other Ambulatory Visit: Payer: Self-pay | Admitting: Internal Medicine

## 2015-04-29 NOTE — Telephone Encounter (Signed)
Pt is requesting refill on Cialis.   Last OV: 12/21/2014 Last Fill: 10/19/2014 #[id number]   Okay to refill?

## 2015-04-29 NOTE — Telephone Encounter (Signed)
ok #30 and 5 refills

## 2015-07-18 ENCOUNTER — Other Ambulatory Visit: Payer: Self-pay

## 2015-07-18 MED ORDER — ESOMEPRAZOLE MAGNESIUM 40 MG PO CPDR: 40.0000 mg | DELAYED_RELEASE_CAPSULE | Freq: Every day | ORAL | Status: DC

## 2015-09-19 ENCOUNTER — Other Ambulatory Visit: Payer: Self-pay

## 2015-09-20 ENCOUNTER — Ambulatory Visit (INDEPENDENT_AMBULATORY_CARE_PROVIDER_SITE_OTHER): Payer: BLUE CROSS/BLUE SHIELD | Admitting: Internal Medicine

## 2015-09-20 ENCOUNTER — Encounter: Payer: Self-pay | Admitting: Internal Medicine

## 2015-09-20 VITALS — BP 110/78 | HR 68 | Temp 98.2°F | Ht 71.0 in | Wt 255.1 lb

## 2015-09-20 DIAGNOSIS — M545 Low back pain, unspecified: Secondary | ICD-10-CM

## 2015-09-20 MED ORDER — PREDNISONE 10 MG PO TABS
ORAL_TABLET | ORAL | Status: DC
Start: 2015-09-20 — End: 2015-12-27

## 2015-09-20 MED ORDER — CYCLOBENZAPRINE HCL 10 MG PO TABS: 10.0000 mg | ORAL_TABLET | Freq: Three times a day (TID) | ORAL | Status: DC | PRN

## 2015-09-20 NOTE — Progress Notes (Signed)
Subjective:    Patient ID: Timothy [name], male    DOB: Apr 08, 1966, 50 y.o.   MRN: [id number]  DOS:  09/20/2015 Type of visit - description : Acute visit Interval history: Last week had mild low back pain with some radiation to the left, improved. 2 days ago was bending over and immediately after developed a moderate low back pain with no radiation. Had similar episodes in the past.   Review of Systems Denies fever chills No nausea, vomiting, diarrhea. No rash No dysuria or gross hematuria No lower extremity paresthesias or weakness  Past Medical History  Diagnosis Date  . Allergic rhinitis   . Hypertriglyceridemia   . Erectile dysfunction   . GERD (gastroesophageal reflux disease)   . Undescended right testicle     s/p surgery as a child---us 11-11 "absent right testcile"  . Chest pain 04-09-2008    stress test normal and echo (NI), GB US negative had a previous eval by cards as well  . Shingles 03-2009  . Right groin pain 2016    Dr. Jeffie Pollock, plan is renal US, and cystoscopy    Past Surgical History  Procedure Laterality Date  . Other surgical history      undescended testicle surgery, right  . Vasectomy  2009    Social History   Social History  . Marital Status: Married    Spouse Name: N/A  . Number of Children: 1  . Years of Education: N/A   Occupational History  . controller    Social History Main Topics  . Smoking status: Never Smoker   . Smokeless tobacco: Never Used  . Alcohol Use: 1.8 oz/week    3 Cans of beer per week     Comment: socially   . Drug Use: No  . Sexual Activity: Not on file   Other Topics Concern  . Not on file   Social History Narrative   Child 34   Divorced, has a steady girlfriend        Medication List       This list is accurate as of: 09/20/15  5:34 PM.  Always use your most recent med list.               cyclobenzaprine 10 MG tablet  Commonly known as:  FLEXERIL  Take 1 tablet (10 mg total) by mouth 3 (three)  times daily as needed for muscle spasms.     esomeprazole 40 MG capsule  Commonly known as:  NEXIUM  Take 1 capsule (40 mg total) by mouth daily before breakfast.     predniSONE 10 MG tablet  Commonly known as:  DELTASONE  4 tablets x 2 days, 3 tabs x 2 days, 2 tabs x 2 days, 1 tab x 2 days     tadalafil 5 MG tablet  Commonly known as:  CIALIS  Take 0.5 tablets (2.5 mg total) by mouth daily.           Objective:   Physical Exam BP 110/78 mmHg  Pulse 68  Temp(Src) 98.2 F (36.8 C) (Oral)  Ht 5\' 11"  (1.803 m)  Wt 255 lb 2 oz (115.724 kg)  BMI 35.60 kg/m2  SpO2 97% General:   Well developed, well nourished . NAD.  HEENT:  Normocephalic . Face symmetric, atraumatic MSK: No TTP of the lower back Skin: Not pale. Not jaundice Neurologic:  alert & oriented X3.  Speech normal, gait appropriate for age, + antalgic posture  when he tries to come  up from laying down on the table. DTRs and motor symmetric, straight leg test negative   Psych--  Cognition and judgment appear intact.  Cooperative with normal attention span and concentration.  Behavior appropriate. No anxious or depressed appearing.      Assessment & Plan:   Assessment Dyslipidemia GERD Chest pain 2009, normal echo/ stress test/ GB US Shingles 2010 GU: --Undescended testicle, R, s/p surgery -- UTI recurrent (third UTI dx 12-2014), groin pain >>>saw urology-- rx [place] MSK: sporadic back pain, s/p local injection x 1 ~ 08-2014 ([facility])  PLAN: Back pain: Neurological exam normal, last week has some radiation to the left buttock but no today. Plan: Conservative treatment, C instructions. Also, due for a CPX, recommend to schedule that at his convenience

## 2015-09-20 NOTE — Progress Notes (Signed)
Pre visit review using our clinic review tool, if applicable. No additional management support is needed unless otherwise documented below in the visit note. 

## 2015-09-20 NOTE — Patient Instructions (Signed)
Rest for  few days  Warm compress  Tylenol as needed for pain  Prednisone  Flexeril, a muscle relaxant  Call if not improving  Once better start some self physical therapy.  [place] with information about home physical therapy for back pain: [email or web address]   Call if severe pain, not getting better

## 2015-10-15 ENCOUNTER — Other Ambulatory Visit: Payer: Self-pay | Admitting: Internal Medicine

## 2015-12-27 ENCOUNTER — Ambulatory Visit (INDEPENDENT_AMBULATORY_CARE_PROVIDER_SITE_OTHER): Payer: BLUE CROSS/BLUE SHIELD | Admitting: Internal Medicine

## 2015-12-27 ENCOUNTER — Encounter: Payer: Self-pay | Admitting: Internal Medicine

## 2015-12-27 VITALS — BP 122/78 | HR 62 | Temp 98.0°F | Ht 71.0 in | Wt 252.0 lb

## 2015-12-27 DIAGNOSIS — N390 Urinary tract infection, site not specified: Secondary | ICD-10-CM | POA: Diagnosis not present

## 2015-12-27 DIAGNOSIS — Z0000 Encounter for general adult medical examination without abnormal findings: Secondary | ICD-10-CM

## 2015-12-27 DIAGNOSIS — Z09 Encounter for follow-up examination after completed treatment for conditions other than malignant neoplasm: Secondary | ICD-10-CM

## 2015-12-27 DIAGNOSIS — Z125 Encounter for screening for malignant neoplasm of prostate: Secondary | ICD-10-CM | POA: Diagnosis not present

## 2015-12-27 DIAGNOSIS — Z23 Encounter for immunization: Secondary | ICD-10-CM

## 2015-12-27 LAB — LIPID PANEL
Cholesterol: 134 mg/dL (ref 125–200)
HDL: 28 mg/dL — ABNORMAL LOW (ref >=40)
LDL Cholesterol: 68 mg/dL (ref <130)
Total CHOL/HDL Ratio: 4.8 Ratio (ref <=5.0)
Triglycerides: 191 mg/dL — ABNORMAL HIGH (ref <150)
VLDL: 38 mg/dL — ABNORMAL HIGH (ref <30)

## 2015-12-27 LAB — BASIC METABOLIC PANEL
BUN: 15 mg/dL (ref 7–25)
CO2: 27 mmol/L (ref 20–31)
CREATININE: 0.91 mg/dL (ref 0.70–1.33)
Calcium: 8.9 mg/dL (ref 8.6–10.3)
Chloride: 102 mmol/L (ref 98–110)
Glucose, Bld: 90 mg/dL (ref 65–99)
Potassium: 4 mmol/L (ref 3.5–5.3)
Sodium: 137 mmol/L (ref 135–146)

## 2015-12-27 MED ORDER — FLUTICASONE PROPIONATE 50 MCG/ACT NA SUSP: 2.0000 | Freq: Every day | NASAL | Status: DC

## 2015-12-27 MED ORDER — AZELASTINE HCL 0.1 % NA SOLN: 2.0000 | Freq: Every evening | NASAL | Status: DC | PRN

## 2015-12-27 MED ORDER — LEVOCETIRIZINE DIHYDROCHLORIDE 5 MG PO TABS: 5.0000 mg | ORAL_TABLET | Freq: Every evening | ORAL | Status: DC

## 2015-12-27 MED ORDER — TADALAFIL 5 MG PO TABS: 5.0000 mg | ORAL_TABLET | Freq: Every day | ORAL | Status: DC

## 2015-12-27 NOTE — Progress Notes (Signed)
Pre visit review using our clinic review tool, if applicable. No additional management support is needed unless otherwise documented below in the visit note. 

## 2015-12-27 NOTE — Assessment & Plan Note (Addendum)
Td 2005, tetanus shot today  counseled about diet-exercise CCS: discussed cscope vs ifob, elected stools based testing for now, IFOB provided  Elevated PSA 5-16 in the context of a UTI, DRE today slightly increased size  prostate gland, recheck a PSA, UA, urine culture labs -- see orders

## 2015-12-27 NOTE — Patient Instructions (Signed)
GO TO THE LAB : Get the blood work     GO TO THE FRONT DESK  Schedule a physical exam in one year, fasting

## 2015-12-27 NOTE — Progress Notes (Signed)
Subjective:    Patient ID: Timothy [name], male    DOB: Apr 24, 1966, 50 y.o.   MRN: [id number]  DOS:  12/27/2015 Type of visit - description : cpx Interval history: Has few concerns, needs RFs   Review of Systems Constitutional: No fever. No chills. No unexplained wt changes. No unusual sweats  HEENT: No dental problems, no ear discharge, no facial swelling, no voice changes. No eye discharge, no eye  redness , no  intolerance to light   Respiratory: No wheezing , no  difficulty breathing. No cough , no mucus production  Cardiovascular: No CP, no leg swelling , no  Palpitations  GI: no nausea, no vomiting, no diarrhea , no  abdominal pain.  No blood in the stools. No dysphagia, no odynophagia    Endocrine: No polyphagia, no polyuria , no polydipsia  GU: No dysuria, gross hematuria, difficulty urinating. No urinary urgency, no frequency.  Musculoskeletal: Ongoing low back pain, occasionally radiates to the left, no bladder or bowel incontinence  Skin: No change in the color of the skin, palor , no  Rash  Allergic, immunologic: + environmental allergies, needs a RF of xyzal although is not working as well as last year , no  food allergies  Neurological: No dizziness no  syncope. No headaches. No diplopia, no slurred, no slurred speech, no motor deficits, no facial  Numbness  Hematological: No enlarged lymph nodes, no easy bruising , no unusual bleedings  Psychiatry: No suicidal ideas, no hallucinations, no beavior problems, no confusion.  No unusual/severe anxiety, no depression    Past Medical History  Diagnosis Date  . Allergic rhinitis   . Hypertriglyceridemia   . Erectile dysfunction   . GERD (gastroesophageal reflux disease)   . Undescended right testicle     s/p surgery as a child---us 11-11 "absent right testcile"  . Chest pain 04-09-2008    stress test normal and echo (NI), GB US negative had a previous eval by cards as well  . Shingles 03-2009  . Right groin pain  2016    Dr. Jeffie Pollock, plan is renal US, and cystoscopy    Past Surgical History  Procedure Laterality Date  . Other surgical history      undescended testicle surgery, right  . Vasectomy  2009    Social History   Social History  . Marital Status: Married    Spouse Name: N/A  . Number of Children: 1  . Years of Education: N/A   Occupational History  . controller    Social History Main Topics  . Smoking status: Never Smoker   . Smokeless tobacco: Never Used  . Alcohol Use: 1.8 oz/week    3 Cans of beer per week     Comment: socially   . Drug Use: No  . Sexual Activity: Not on file   Other Topics Concern  . Not on file   Social History Narrative   Child 1988   Divorced, has a steady girlfriend, getting married 01-2016     Family History  Problem Relation Age of Onset  . Asthma Mother   . Diabetes Father   . Cancer Brother     bone   . Breast cancer Sister   . Allergies Mother   . Lung cancer Mother   . Colon cancer Neg Hx   . Prostate cancer Neg Hx   . CAD Neg Hx        Medication List       This list is  accurate as of: 12/27/15 11:59 PM.  Always use your most recent med list.               azelastine 0.1 % nasal spray  Commonly known as:  ASTELIN  Place 2 sprays into both nostrils at bedtime as needed for rhinitis. Use in each nostril as directed     esomeprazole 40 MG capsule  Commonly known as:  NEXIUM  Take 1 capsule (40 mg total) by mouth daily before breakfast.     fluticasone 50 MCG/ACT nasal spray  Commonly known as:  FLONASE  Place 2 sprays into both nostrils daily.     levocetirizine 5 MG tablet  Commonly known as:  XYZAL  Take 1 tablet (5 mg total) by mouth every evening.     tadalafil 5 MG tablet  Commonly known as:  CIALIS  Take 1 tablet (5 mg total) by mouth daily.           Objective:   Physical Exam BP 122/78 mmHg  Pulse 62  Temp(Src) 98 F (36.7 C) (Oral)  Ht 5\' 11"  (1.803 m)  Wt 252 lb (114.306 kg)  BMI 35.16  kg/m2  SpO2 99%  General:   Well developed, well nourished . NAD.  Neck: No  thyromegaly  HEENT:  Normocephalic . Face symmetric, atraumatic Lungs:  CTA B Normal respiratory effort, no intercostal retractions, no accessory muscle use. Heart: RRR,  no murmur.  No pretibial edema bilaterally  Abdomen:  Not distended, soft, non-tender. No rebound or rigidity.   Rectal:  External abnormalities: none. Normal sphincter tone. No rectal masses or tenderness.  Stools found Prostate: Prostate gland firm and smooth, slightly enlargement, no nodularity, tenderness, mass, asymmetry or induration.  Skin: Exposed areas without rash. Not pale. Not jaundice Neurologic:  alert & oriented X3.  Speech normal, gait appropriate for age and unassisted Strength symmetric and appropriate for age.  Psych: Cognition and judgment appear intact.  Cooperative with normal attention span and concentration.  Behavior appropriate. No anxious or depressed appearing.    Assessment & Plan:   Assessment Dyslipidemia GERD Chest pain 2009, normal echo/ stress test/ GB US Shingles 2010 GU: --Undescended testicle, R, s/p surgery -- UTI recurrent (third UTI dx 12-2014), groin pain >>>saw urology 02-2015-- rx [place] --Increased PSA 12-2014 in the context of a UTI, saw urology 02-2015, apparently elevated PSA not adressed MSK: chronic  back pain, onset ~2015, s/p local injection x 1 ~ 08-2014 ([facility])  PLAN: Allergies: Rx xyzal,Flonase, Astelin. Chronic back pain: Recommend to discuss with ortho. RTC One year  RTC one year

## 2015-12-28 LAB — PSA: PSA: 1.62 ng/mL (ref <=4.00)

## 2015-12-28 LAB — URINALYSIS, ROUTINE W REFLEX MICROSCOPIC
BILIRUBIN URINE: NEGATIVE
Glucose, UA: NEGATIVE
Hgb urine dipstick: NEGATIVE
KETONES UR: NEGATIVE
LEUKOCYTES UA: NEGATIVE
Nitrite: NEGATIVE
PROTEIN: NEGATIVE
Specific Gravity, Urine: 1.016 (ref 1.001–1.035)
pH: 7 (ref 5.0–8.0)

## 2015-12-28 LAB — URINE CULTURE

## 2015-12-30 DIAGNOSIS — Z09 Encounter for follow-up examination after completed treatment for conditions other than malignant neoplasm: Secondary | ICD-10-CM | POA: Insufficient documentation

## 2015-12-30 NOTE — Assessment & Plan Note (Signed)
Allergies: Rx xyzal,Flonase, Astelin. Chronic back pain: Recommend to discuss with ortho. RTC One year

## 2016-03-25 ENCOUNTER — Other Ambulatory Visit: Payer: Self-pay | Admitting: Internal Medicine

## 2016-05-01 ENCOUNTER — Encounter: Payer: Self-pay | Admitting: Family Medicine

## 2016-05-01 ENCOUNTER — Ambulatory Visit (HOSPITAL_BASED_OUTPATIENT_CLINIC_OR_DEPARTMENT_OTHER)
Admission: RE | Admit: 2016-05-01 | Discharge: 2016-05-01 | Disposition: A | Discharge status: Home / Self Care | Payer: BLUE CROSS/BLUE SHIELD | Source: Ambulatory Visit | Attending: Family Medicine | Admitting: Family Medicine

## 2016-05-01 ENCOUNTER — Ambulatory Visit (INDEPENDENT_AMBULATORY_CARE_PROVIDER_SITE_OTHER): Payer: BLUE CROSS/BLUE SHIELD | Admitting: Family Medicine

## 2016-05-01 VITALS — BP 106/60 | HR 65 | Temp 99.4°F | Ht 71.0 in | Wt 253.8 lb

## 2016-05-01 DIAGNOSIS — N3090 Cystitis, unspecified without hematuria: Secondary | ICD-10-CM | POA: Diagnosis not present

## 2016-05-01 DIAGNOSIS — R109 Unspecified abdominal pain: Secondary | ICD-10-CM | POA: Diagnosis not present

## 2016-05-01 DIAGNOSIS — R319 Hematuria, unspecified: Secondary | ICD-10-CM

## 2016-05-01 DIAGNOSIS — R10A0 Flank pain, unspecified side: Secondary | ICD-10-CM

## 2016-05-01 LAB — POC URINALSYSI DIPSTICK (AUTOMATED)
BILIRUBIN UA: NEGATIVE
GLUCOSE UA: NEGATIVE
KETONES UA: NEGATIVE
Nitrite, UA: NEGATIVE
SPEC GRAV UA: 1.015
Urobilinogen, UA: 1
pH, UA: 8

## 2016-05-01 MED ORDER — CIPROFLOXACIN HCL 250 MG PO TABS: 250.0000 mg | ORAL_TABLET | Freq: Two times a day (BID) | ORAL | 0 refills | Status: AC

## 2016-05-01 NOTE — Progress Notes (Signed)
Pre visit review using our clinic review tool, if applicable. No additional management support is needed unless otherwise documented below in the visit note. 

## 2016-05-01 NOTE — Progress Notes (Signed)
Chief Complaint  Patient presents with  . Dysuria    along with lower (R) back and groin area-sxs started last night worse this am.    Timothy [name] is a 50 y.o. male here for possible UTI vs kidney stone.  Duration: 1 day. Symptoms: flank pain on right, pain with urination Denies: urinary frequency, hematuria, nausea and vomiting Hx of recurrent UTI? Yes  Hx of kidney stones? No; +family history dad and sister Denies new sexual partners. He has seen urology in the past for recurrent UTI. He does not currently see them and does not remember any significant findings from his workup. His bowel movements are normal.   ROS:  Constitutional: denies fever GU: As noted in HPI MSK: +Flank pain Abd: Denies constipation or abdominal pain  Past Medical History:  Diagnosis Date  . Allergic rhinitis   . Chest pain 04-09-2008   stress test normal and echo (NI), GB US negative had a previous eval by cards as well  . Erectile dysfunction   . GERD (gastroesophageal reflux disease)   . Hypertriglyceridemia   . Right groin pain 2016   Dr. Jeffie Pollock, plan is renal US, and cystoscopy  . Shingles 03-2009  . Undescended right testicle    s/p surgery as a child---us 11-11 "absent right testcile"   Family History  Problem Relation Age of Onset  . Asthma Mother   . Diabetes Father   . Cancer Brother     bone   . Breast cancer Sister   . Allergies Mother   . Lung cancer Mother   . Colon cancer Neg Hx   . Prostate cancer Neg Hx   . CAD Neg Hx    Social History   Social History  . Marital status: Married  . Number of children: 1   Occupational History  . controller    Social History Main Topics  . Smoking status: Never Smoker  . Smokeless tobacco: Never Used  . Alcohol use 1.8 oz/week    3 Cans of beer per week     Comment: socially   . Drug use: No   Social History Narrative   Child 1988   Divorced, has a steady girlfriend, getting married 01-2016    BP 106/60 (BP Location: Left  Arm, Patient Position: Sitting, Cuff Size: Large)   Pulse 65   Temp 99.4 F (37.4 C) (Oral)   Ht 5\' 11"  (1.803 m)   Wt 253 lb 12.8 oz (115.1 kg)   SpO2 98%   BMI 35.40 kg/m  General: Awake, alert, appears stated age 62: MMM Heart: RRR, no murmurs Lungs: CTAB, normal respiratory effort, no accessory muscle usage Abd: BS+, soft, Tender in lower quadrants bilaterally, ND, no masses or organomegaly; no prostate tenderness or bogginess MSK: No CVA tenderness, neg Lloyd's sign Psych: Age appropriate judgment and insight  Cystitis - Plan: ciprofloxacin (CIPRO) 250 MG tablet, POCT Urinalysis Dipstick (Automated), Urine Culture  Hematuria - Plan: DG Abd 1 View, POCT Urinalysis Dipstick (Automated), Urine Culture  Flank pain - Plan: DG Abd 1 View, POCT Urinalysis Dipstick (Automated), Urine Culture  Orders as above. No fevers or suggestion of renal infection. Urine concerning for infection. Will culture. XR to r/o stone, no hx of gout. I do not see any evidence of a stone. Await final read.  Discussed going to ED if he develops a fever, worsening pain, or new symptoms.  F/u in 7 days if symptoms fail to improve. The patient voiced understanding and agreement  to the plan.  [place], DO 05/01/16 4:37 PM

## 2016-05-04 LAB — URINE CULTURE

## 2016-07-01 ENCOUNTER — Other Ambulatory Visit: Payer: Self-pay | Admitting: Internal Medicine

## 2016-08-25 ENCOUNTER — Telehealth: Payer: Self-pay | Admitting: Internal Medicine

## 2016-08-25 NOTE — Telephone Encounter (Signed)
Patient called stating that he left a urine sample during his appointment on 05/01/16 with Dr. Nani Ravens. He states that it was sent to [facility] and [employer] is not in network with that lab. He states that [facility] [place] denied payment of the test and he is now receiving a bill. Please advise.   Phone: [phone]

## 2016-08-27 NOTE — Telephone Encounter (Signed)
Called patient and he stated he got the bill straightened out

## 2016-09-04 ENCOUNTER — Encounter: Payer: Self-pay | Admitting: Internal Medicine

## 2016-09-04 NOTE — Telephone Encounter (Signed)
error:[id number] ° °

## 2016-09-04 NOTE — Telephone Encounter (Signed)
Patient has additional questions regarding bill and would like to discuss bill mentioned below, please advise

## 2016-09-04 NOTE — Telephone Encounter (Signed)
Patient says [facility] informed him he would need to contact our office to see if we could get this resolved. Patient received $200 bill from visit because provider was not credentialed with [facility]. Forwarding to Engineer, building services for assistance.

## 2016-09-15 NOTE — Telephone Encounter (Signed)
Returned patients call. He left a voicemail asking for an update on the bill. Called him back to let him know I am still waiting to hear from our rep at [facility] and I will call him back by the end of the week with an update.

## 2016-09-23 ENCOUNTER — Ambulatory Visit (INDEPENDENT_AMBULATORY_CARE_PROVIDER_SITE_OTHER): Payer: BLUE CROSS/BLUE SHIELD | Admitting: Internal Medicine

## 2016-09-23 ENCOUNTER — Encounter: Payer: Self-pay | Admitting: Internal Medicine

## 2016-09-23 VITALS — BP 128/80 | HR 65 | Temp 98.1°F | Resp 14 | Ht 71.0 in | Wt 259.2 lb

## 2016-09-23 DIAGNOSIS — M545 Low back pain: Secondary | ICD-10-CM

## 2016-09-23 DIAGNOSIS — G8929 Other chronic pain: Secondary | ICD-10-CM | POA: Diagnosis not present

## 2016-09-23 MED ORDER — CYCLOBENZAPRINE HCL 10 MG PO TABS: 10.0000 mg | ORAL_TABLET | Freq: Every evening | ORAL | 0 refills | Status: DC | PRN

## 2016-09-23 MED ORDER — IBUPROFEN 800 MG PO TABS: 800.0000 mg | ORAL_TABLET | Freq: Three times a day (TID) | ORAL | 0 refills | Status: DC | PRN

## 2016-09-23 MED ORDER — PREDNISONE 10 MG PO TABS: ORAL_TABLET | ORAL | 0 refills | Status: DC

## 2016-09-23 NOTE — Patient Instructions (Signed)
Rest  Heating pad  IBUPROFEN 800 mg 1 tablet every 8 hours as needed for pain. (do not mix with ibuprofen OTC) Always take it with food because may cause gastritis and ulcers.  If you notice nausea, stomach pain, change in the color of stools --->  Stop the medicine and let us know   Ok to add Tylenol  500 mg OTC 2 tabs a day every 8 hours as needed for pain   Flexeril at night (drowsiness)  Stretching once better

## 2016-09-23 NOTE — Progress Notes (Signed)
Subjective:    Patient ID: Timothy [name], male    DOB: 1966-08-06, 51 y.o.   MRN: [id number]  DOS:  09/23/2016 Type of visit - description : acute Interval history: He has chronic, mild on and off back pain. A week ago he did some lifting at home and shortly after developed back pain, low, bilateral. No radiation but has some achiness at the left buttock. Over the last 2 days the pain has been more sharp and he sometimes feels unable to walk straight, needs to walk bending over.   Review of Systems No fever chills. No rash No lower extremity paresthesias or feet numbness. No bladder or bowel incontinence  Past Medical History:  Diagnosis Date  . Allergic rhinitis   . Chest pain 04-09-2008   stress test normal and echo (NI), GB US negative had a previous eval by cards as well  . Erectile dysfunction   . GERD (gastroesophageal reflux disease)   . Hypertriglyceridemia   . Right groin pain 2016   Dr. Jeffie Pollock, plan is renal US, and cystoscopy  . Shingles 03-2009  . 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 OTHER SURGICAL HISTORY     undescended testicle surgery, right  . VASECTOMY  2009    Social History   Social History  . Marital status: Married    Spouse name: N/A  . Number of children: 1  . Years of education: N/A   Occupational History  . controller    Social History Main Topics  . Smoking status: Never Smoker  . Smokeless tobacco: Never Used  . Alcohol use 1.8 oz/week    3 Cans of beer per week     Comment: socially   . Drug use: No  . Sexual activity: Not on file   Other Topics Concern  . Not on file   Social History Narrative   Child 1988   Divorced, has a steady girlfriend, getting married 01-2016      Allergies as of 09/23/2016      Reactions   Aspirin Rash   Decongest Rash   rash   Penicillins Rash      Medication List       Accurate as of  09/23/16 11:59 PM. Always use your most recent med list.          azelastine 0.1 % nasal spray Commonly known as:  ASTELIN Place 2 sprays into both nostrils at bedtime as needed for rhinitis. Use in each nostril as directed   cyclobenzaprine 10 MG tablet Commonly known as:  FLEXERIL Take 1 tablet (10 mg total) by mouth at bedtime as needed for muscle spasms.   esomeprazole 40 MG capsule Commonly known as:  NEXIUM Take 1 capsule (40 mg total) by mouth daily before breakfast.   fluticasone 50 MCG/ACT nasal spray Commonly known as:  FLONASE Place 2 sprays into both nostrils daily.   ibuprofen 800 MG tablet Commonly known as:  ADVIL,MOTRIN Take 1 tablet (800 mg total) by mouth every 8 (eight) hours as needed.   levocetirizine 5 MG tablet Commonly known as:  XYZAL Take 1 tablet (5 mg total) by mouth every evening.   predniSONE 10 MG tablet Commonly known as:  DELTASONE 5 tablets x 2 days, 4 tablets x 2 days, 3 tabs x 2 days, 2 tabs x 2 days, 1 tab x 2 days  tadalafil 5 MG tablet Commonly known as:  CIALIS Take 1 tablet (5 mg total) by mouth daily as needed for erectile dysfunction.            Durable Medical Equipment        Start     Ordered   09/23/16 0000  For home use only DME Other see comment    Comments:  Inversion Table-Dx:M54.9 NPI: [id number]   09/23/16 1647         Objective:   Physical Exam BP 128/80 (BP Location: Left Arm, Patient Position: Sitting, Cuff Size: Normal)   Pulse 65   Temp 98.1 F (36.7 C) (Oral)   Resp 14   Ht 5\' 11"  (1.803 m)   Wt 259 lb 4 oz (117.6 kg)   SpO2 97%   BMI 36.16 kg/m  General:   Well developed, well nourished . NAD.  HEENT:  Normocephalic . Face symmetric, atraumatic MSK: No TTP at the thoracolumbar spine. Hip rotation is normal. Neurologic:  alert & oriented X3.  Speech normal, gait unassisted somewhat antalgic. Posture is also antalgic when he lays down in the exam table. DTRs symmetric. Straight leg test  negative Psych--  Cognition and judgment appear intact.  Cooperative with normal attention span and concentration.  Behavior appropriate. No anxious or depressed appearing.      Assessment & Plan:   Assessment Dyslipidemia GERD Chest pain 2009, normal echo/ stress test/ GB US Shingles 2010 GU: --Undescended testicle, R, s/p surgery -- UTI recurrent (third UTI dx 12-2014), groin pain >>>saw urology 02-2015-- rx [place] --Increased PSA 12-2014 in the context of a UTI, saw urology 02-2015, apparently elevated PSA not adressed MSK: chronic  back pain, onset ~2015, s/p local injection x 1 ~ 08-2014 ([facility]) Aspirin allergy (able to take ibuprofen regularly)  PLAN: Back pain exacerbation: Patient presents with chronic back pain exacerbation, neurow exam is normal, no red flag symptoms. We'll treat him with prednisone, Flexeril, ibuprofen and Tylenol. See instructions. rec to stretch once better . He is going on a ski trip in few days, advise him to be very careful.

## 2016-09-23 NOTE — Progress Notes (Signed)
Pre visit review using our clinic review tool, if applicable. No additional management support is needed unless otherwise documented below in the visit note. 

## 2016-09-24 ENCOUNTER — Ambulatory Visit: Payer: Self-pay | Admitting: Internal Medicine

## 2016-09-24 NOTE — Assessment & Plan Note (Signed)
Back pain exacerbation: Patient presents with chronic back pain exacerbation, neurow exam is normal, no red flag symptoms. We'll treat him with prednisone, Flexeril, ibuprofen and Tylenol. See instructions. rec to stretch once better . He is going on a ski trip in few days, advise him to be very careful.

## 2016-10-11 ENCOUNTER — Other Ambulatory Visit: Payer: Self-pay | Admitting: Internal Medicine

## 2016-10-26 ENCOUNTER — Other Ambulatory Visit: Payer: Self-pay | Admitting: Internal Medicine

## 2016-10-28 ENCOUNTER — Encounter: Payer: Self-pay | Admitting: Internal Medicine

## 2016-12-05 ENCOUNTER — Other Ambulatory Visit: Payer: Self-pay | Admitting: Internal Medicine

## 2016-12-07 ENCOUNTER — Encounter: Payer: Self-pay | Admitting: Internal Medicine

## 2016-12-07 ENCOUNTER — Ambulatory Visit (INDEPENDENT_AMBULATORY_CARE_PROVIDER_SITE_OTHER): Payer: BLUE CROSS/BLUE SHIELD | Admitting: Internal Medicine

## 2016-12-07 VITALS — BP 122/74 | HR 65 | Temp 97.7°F | Resp 14 | Ht 71.0 in | Wt 237.5 lb

## 2016-12-07 DIAGNOSIS — Z0000 Encounter for general adult medical examination without abnormal findings: Secondary | ICD-10-CM

## 2016-12-07 DIAGNOSIS — G8929 Other chronic pain: Secondary | ICD-10-CM

## 2016-12-07 DIAGNOSIS — Z1211 Encounter for screening for malignant neoplasm of colon: Secondary | ICD-10-CM

## 2016-12-07 DIAGNOSIS — L309 Dermatitis, unspecified: Secondary | ICD-10-CM

## 2016-12-07 DIAGNOSIS — N400 Enlarged prostate without lower urinary tract symptoms: Secondary | ICD-10-CM | POA: Diagnosis not present

## 2016-12-07 DIAGNOSIS — M5442 Lumbago with sciatica, left side: Secondary | ICD-10-CM

## 2016-12-07 MED ORDER — CYCLOBENZAPRINE HCL 10 MG PO TABS: 10.0000 mg | ORAL_TABLET | Freq: Every evening | ORAL | 5 refills | Status: DC | PRN

## 2016-12-07 MED ORDER — TADALAFIL 5 MG PO TABS: 5.0000 mg | ORAL_TABLET | Freq: Every day | ORAL | 3 refills | Status: DC | PRN

## 2016-12-07 NOTE — Progress Notes (Signed)
Pre visit review using our clinic review tool, if applicable. No additional management support is needed unless otherwise documented below in the visit note. 

## 2016-12-07 NOTE — Assessment & Plan Note (Addendum)
Td 2017  --[facility]:  Request a GI referral   --Prostate cancer screening:  DRE 12-2015: Prostate was slightly enlarged, check a PSA today --counseled about diet-exercise --labs  : CMP, FLP, PSA, UA, urine culture , CBC, TSH

## 2016-12-07 NOTE — Progress Notes (Signed)
Subjective:    Patient ID: Timothy [name], male    DOB: 09/29/1965, 51 y.o.   MRN: [id number]  DOS:  12/07/2016 Type of visit - description : cpx Interval history: Several concerns   Review of Systems On and off dry skin at the right plantar area, cracking, occasional itching, hands are not affected. Sx better with creams but would like a more permanent solution. Ongoing back pain, every day, taking Tylenol and Advil one of each daily. Looking for some relief.  Other than above, a 14 point review of systems is negative     Past Medical History:  Diagnosis Date  . Allergic rhinitis   . Chest pain 04-09-2008   stress test normal and echo (NI), GB US negative had a previous eval by cards as well  . Erectile dysfunction   . GERD (gastroesophageal reflux disease)   . Hypertriglyceridemia   . Right groin pain 2016   Dr. Jeffie Pollock, plan is renal US, and cystoscopy  . Shingles 03-2009  . 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 OTHER SURGICAL HISTORY     undescended testicle surgery, right  . VASECTOMY  2009    Social History   Social History  . Marital status: Married    Spouse name: N/A  . Number of children: 1  . Years of education: N/A   Occupational History  . controller    Social History Main Topics  . Smoking status: Never Smoker  . Smokeless tobacco: Never Used  . Alcohol use 1.8 oz/week    3 Cans of beer per week     Comment: socially   . Drug use: No  . Sexual activity: Not on file   Other Topics Concern  . Not on file   Social History Narrative   Child 1988   Divorced, has a steady girlfriend, got  married 01-2016     Family History  Problem Relation Age of Onset  . Asthma Mother   . Allergies Mother   . Lung cancer Mother   . Diabetes Father   . Cancer Brother     bone   . Breast cancer Sister   . Colon cancer Neg Hx   . Prostate cancer Neg  Hx   . CAD Neg Hx      Allergies as of 12/07/2016      Reactions   Aspirin Rash   Decongest Rash   rash   Penicillins Rash      Medication List       Accurate as of 12/07/16 11:59 PM. Always use your most recent med list.          cyclobenzaprine 10 MG tablet Commonly known as:  FLEXERIL Take 1 tablet (10 mg total) by mouth at bedtime as needed for muscle spasms.   esomeprazole 40 MG capsule Commonly known as:  NEXIUM Take 1 capsule (40 mg total) by mouth daily before breakfast.   fluticasone 50 MCG/ACT nasal spray Commonly known as:  FLONASE Place 2 sprays into both nostrils daily.   ibuprofen 800 MG tablet Commonly known as:  ADVIL,MOTRIN Take 1 tablet (800 mg total) by mouth every 8 (eight) hours as needed.   levocetirizine 5 MG tablet Commonly known as:  XYZAL Take 1 tablet (5 mg total) by mouth every evening.   tadalafil 5 MG tablet Commonly known as:  CIALIS Take 1 tablet (  5 mg total) by mouth daily as needed for erectile dysfunction.          Objective:   Physical Exam BP 122/74 (BP Location: Left Arm, Patient Position: Sitting, Cuff Size: Normal)   Pulse 65   Temp 97.7 F (36.5 C) (Oral)   Resp 14   Ht 5\' 11"  (1.803 m)   Wt 237 lb 8 oz (107.7 kg)   SpO2 98%   BMI 33.12 kg/m  General:   Well developed, well nourished . NAD.  Neck: No thyromegaly HEENT:  Normocephalic . Face symmetric, atraumatic Lungs:  CTA B Normal respiratory effort, no intercostal retractions, no accessory muscle use. Heart: RRR,  no murmur.  no pretibial edema bilaterally  Abdomen:  Not distended, soft, non-tender. No rebound or rigidity.  Skin: Right plantar area with minimal dryness, no blisters, cracking, redness, scales. Neurologic:  alert & oriented X3.  Speech normal, gait appropriate for age and unassisted Psych--  Cognition and judgment appear intact.  Cooperative with normal attention span and concentration.  Behavior appropriate. No anxious or  depressed appearing.    Assessment & Plan:   Assessment Dyslipidemia GERD Chest pain 2009, normal echo/ stress test/ GB US Shingles 2010 GU: --Undescended testicle, R, s/p surgery -- UTI recurrent (third UTI dx 12-2014), groin pain >>>saw urology 02-2015-- rx [place] --Increased PSA 12-2014 in the context of a UTI, saw urology 02-2015, apparently elevated PSA not adressed MSK: chronic  back pain, onset ~2015, s/p local injection x 1 ~ 08-2014 ([facility]) Aspirin allergy (able to take ibuprofen regularly)  PLAN: Dyslipidemia: Diet control, checking lab GERD: Concerned about s/e of Nexium, recommend to decrease dose to every other day and watch diet. h/o recurrent UTIs: Currently asx. Check a UA urine culture Chronic back pain: Pain is daily, previously had a MRI, showed DJD and disc protrusions, currently on Tylenol Motrin one of each daily. Request referral, used to see Dr. Nelva Bush, will send him to a different practitioner per patient request Chronic dermatitis, R foot: On and off, creams help temporarily, likes a more definitive solution. Refer to dermatology. Fungal?. ED: Printing a prescription for Cialis 90 and 3 RFs RTC 6 months, check

## 2016-12-07 NOTE — Patient Instructions (Addendum)
GO TO THE LAB : Get the blood work     GO TO THE FRONT DESK Schedule your next appointment for a   checkup in 6 months 

## 2016-12-08 LAB — CBC WITH DIFFERENTIAL/PLATELET
BASOS ABS: 0.1 10*3/uL (ref 0.0–0.1)
Basophils Relative: 0.8 % (ref 0.0–3.0)
Eosinophils Absolute: 0.2 10*3/uL (ref 0.0–0.7)
Eosinophils Relative: 3.4 % (ref 0.0–5.0)
HCT: 44.2 % (ref 39.0–52.0)
Hemoglobin: 15 g/dL (ref 13.0–17.0)
LYMPHS ABS: 2.4 10*3/uL (ref 0.7–4.0)
Lymphocytes Relative: 38.2 % (ref 12.0–46.0)
MCHC: 34 g/dL (ref 30.0–36.0)
MCV: 89 fl (ref 78.0–100.0)
MONO ABS: 0.4 10*3/uL (ref 0.1–1.0)
MONOS PCT: 7.2 % (ref 3.0–12.0)
NEUTROS ABS: 3.1 10*3/uL (ref 1.4–7.7)
NEUTROS PCT: 50.4 % (ref 43.0–77.0)
PLATELETS: 246 10*3/uL (ref 150.0–400.0)
RBC: 4.97 Mil/uL (ref 4.22–5.81)
RDW: 13.9 % (ref 11.5–15.5)
WBC: 6.2 10*3/uL (ref 4.0–10.5)

## 2016-12-08 LAB — URINALYSIS, ROUTINE W REFLEX MICROSCOPIC
Hgb urine dipstick: NEGATIVE
Ketones, ur: 15 — AB
Leukocytes, UA: NEGATIVE
Nitrite: NEGATIVE
PH: 6 (ref 5.0–8.0)
RBC / HPF: NONE SEEN (ref 0–?)
SPECIFIC GRAVITY, URINE: 1.025 (ref 1.000–1.030)
Total Protein, Urine: NEGATIVE
Urine Glucose: NEGATIVE
Urobilinogen, UA: 0.2 (ref 0.0–1.0)

## 2016-12-08 LAB — LIPID PANEL
CHOL/HDL RATIO: 4
Cholesterol: 125 mg/dL (ref 0–200)
HDL: 32.9 mg/dL — AB (ref >39.00)
LDL Cholesterol: 74 mg/dL (ref 0–99)
NonHDL: 92.53
Triglycerides: 95 mg/dL (ref 0.0–149.0)
VLDL: 19 mg/dL (ref 0.0–40.0)

## 2016-12-08 LAB — COMPREHENSIVE METABOLIC PANEL
ALT: 20 U/L (ref 0–53)
AST: 21 U/L (ref 0–37)
Albumin: 4.7 g/dL (ref 3.5–5.2)
Alkaline Phosphatase: 47 U/L (ref 39–117)
BILIRUBIN TOTAL: 1.3 mg/dL — AB (ref 0.2–1.2)
BUN: 15 mg/dL (ref 6–23)
CHLORIDE: 103 meq/L (ref 96–112)
CO2: 28 meq/L (ref 19–32)
Calcium: 9.4 mg/dL (ref 8.4–10.5)
Creatinine, Ser: 0.78 mg/dL (ref 0.40–1.50)
GFR: 111.5 mL/min (ref >60.00)
GLUCOSE: 98 mg/dL (ref 70–99)
POTASSIUM: 3.8 meq/L (ref 3.5–5.1)
Sodium: 138 mEq/L (ref 135–145)
Total Protein: 6.6 g/dL (ref 6.0–8.3)

## 2016-12-08 LAB — TSH: TSH: 0.51 u[IU]/mL (ref 0.35–4.50)

## 2016-12-08 LAB — PSA: PSA: 1.73 ng/mL (ref 0.10–4.00)

## 2016-12-08 LAB — URINE CULTURE: Organism ID, Bacteria: NO GROWTH

## 2016-12-08 NOTE — Assessment & Plan Note (Signed)
Dyslipidemia: Diet control, checking lab GERD: Concerned about s/e of Nexium, recommend to decrease dose to every other day and watch diet. h/o recurrent UTIs: Currently asx. Check a UA urine culture Chronic back pain: Pain is daily, previously had a MRI, showed DJD and disc protrusions, currently on Tylenol Motrin one of each daily. Request referral, used to see Dr. Nelva Bush, will send him to a different practitioner per patient request Chronic dermatitis, R foot: On and off, creams help temporarily, likes a more definitive solution. Refer to dermatology. Fungal?. ED: Printing a prescription for Cialis 90 and 3 RFs RTC 6 months, check

## 2016-12-21 ENCOUNTER — Other Ambulatory Visit: Payer: Self-pay | Admitting: Internal Medicine

## 2016-12-21 DIAGNOSIS — M5416 Radiculopathy, lumbar region: Secondary | ICD-10-CM | POA: Diagnosis not present

## 2016-12-21 DIAGNOSIS — M545 Low back pain: Secondary | ICD-10-CM | POA: Diagnosis not present

## 2016-12-27 ENCOUNTER — Other Ambulatory Visit: Payer: Self-pay | Admitting: Internal Medicine

## 2016-12-29 DIAGNOSIS — M545 Low back pain: Secondary | ICD-10-CM | POA: Diagnosis not present

## 2017-01-05 DIAGNOSIS — M5416 Radiculopathy, lumbar region: Secondary | ICD-10-CM | POA: Diagnosis not present

## 2017-01-06 ENCOUNTER — Encounter: Payer: Self-pay | Admitting: Internal Medicine

## 2017-01-07 ENCOUNTER — Encounter: Payer: Self-pay | Admitting: Gastroenterology

## 2017-01-13 DIAGNOSIS — M5416 Radiculopathy, lumbar region: Secondary | ICD-10-CM | POA: Diagnosis not present

## 2017-01-13 DIAGNOSIS — M5136 Other intervertebral disc degeneration, lumbar region: Secondary | ICD-10-CM | POA: Diagnosis not present

## 2017-01-19 DIAGNOSIS — M5136 Other intervertebral disc degeneration, lumbar region: Secondary | ICD-10-CM | POA: Diagnosis not present

## 2017-01-19 DIAGNOSIS — M5416 Radiculopathy, lumbar region: Secondary | ICD-10-CM | POA: Diagnosis not present

## 2017-01-25 DIAGNOSIS — M5416 Radiculopathy, lumbar region: Secondary | ICD-10-CM | POA: Diagnosis not present

## 2017-01-25 DIAGNOSIS — M5136 Other intervertebral disc degeneration, lumbar region: Secondary | ICD-10-CM | POA: Diagnosis not present

## 2017-01-26 DIAGNOSIS — M5416 Radiculopathy, lumbar region: Secondary | ICD-10-CM | POA: Diagnosis not present

## 2017-02-01 DIAGNOSIS — M5136 Other intervertebral disc degeneration, lumbar region: Secondary | ICD-10-CM | POA: Diagnosis not present

## 2017-02-01 DIAGNOSIS — M5416 Radiculopathy, lumbar region: Secondary | ICD-10-CM | POA: Diagnosis not present

## 2017-02-10 ENCOUNTER — Encounter: Payer: Self-pay | Admitting: Gastroenterology

## 2017-02-10 ENCOUNTER — Ambulatory Visit (AMBULATORY_SURGERY_CENTER): Payer: Self-pay

## 2017-02-10 VITALS — Ht 71.0 in | Wt 234.0 lb

## 2017-02-10 DIAGNOSIS — M5136 Other intervertebral disc degeneration, lumbar region: Secondary | ICD-10-CM | POA: Diagnosis not present

## 2017-02-10 DIAGNOSIS — M5416 Radiculopathy, lumbar region: Secondary | ICD-10-CM | POA: Diagnosis not present

## 2017-02-10 DIAGNOSIS — Z1211 Encounter for screening for malignant neoplasm of colon: Secondary | ICD-10-CM

## 2017-02-10 MED ORDER — SUPREP BOWEL PREP KIT 17.5-3.13-1.6 GM/177ML PO SOLN: 1.0000 | Freq: Once | ORAL | 0 refills | Status: AC

## 2017-02-10 NOTE — Progress Notes (Signed)
No diet meds No home oxygen No past problems with anesthesia No allergies to eggs or soy  Declined emmi 

## 2017-02-23 DIAGNOSIS — M5416 Radiculopathy, lumbar region: Secondary | ICD-10-CM | POA: Diagnosis not present

## 2017-02-24 ENCOUNTER — Encounter: Payer: Self-pay | Admitting: Gastroenterology

## 2017-02-24 ENCOUNTER — Ambulatory Visit (AMBULATORY_SURGERY_CENTER): Payer: BLUE CROSS/BLUE SHIELD | Admitting: Gastroenterology

## 2017-02-24 VITALS — BP 121/68 | HR 67 | Temp 98.6°F | Resp 21 | Ht 71.0 in | Wt 237.0 lb

## 2017-02-24 DIAGNOSIS — D120 Benign neoplasm of cecum: Secondary | ICD-10-CM

## 2017-02-24 DIAGNOSIS — Z1211 Encounter for screening for malignant neoplasm of colon: Secondary | ICD-10-CM | POA: Diagnosis not present

## 2017-02-24 DIAGNOSIS — D121 Benign neoplasm of appendix: Secondary | ICD-10-CM | POA: Diagnosis not present

## 2017-02-24 DIAGNOSIS — D123 Benign neoplasm of transverse colon: Secondary | ICD-10-CM

## 2017-02-24 DIAGNOSIS — Z1212 Encounter for screening for malignant neoplasm of rectum: Secondary | ICD-10-CM

## 2017-02-24 MED ORDER — SODIUM CHLORIDE 0.9 % IV SOLN: 500.0000 mL | INTRAVENOUS | Status: DC

## 2017-02-24 NOTE — Op Note (Signed)
[place] Patient Name: Timothy [name] Procedure Date: 02/24/2017 9:36 AM MRN: [id number] Endoscopist: Mauri Pole , MD Age: 51 Referring MD:  Date of Birth: [birth year] Gender: Male Account #: [id number] Procedure:                Colonoscopy Indications:              Screening for colorectal malignant neoplasm, This                            is the patient's first colonoscopy Medicines:                Monitored Anesthesia Care Procedure:                Pre-Anesthesia Assessment:                           - Prior to the procedure, a History and Physical                            was performed, and patient medications and                            allergies were reviewed. The patient's tolerance of                            previous anesthesia was also reviewed. The risks                            and benefits of the procedure and the sedation                            options and risks were discussed with the patient.                            All questions were answered, and informed consent                            was obtained. Prior Anticoagulants: The patient has                            taken no previous anticoagulant or antiplatelet                            agents. ASA Grade Assessment: II - A patient with                            mild systemic disease. After reviewing the risks                            and benefits, the patient was deemed in                            satisfactory condition to undergo the procedure.  After obtaining informed consent, the colonoscope                            was passed under direct vision. Throughout the                            procedure, the patient's blood pressure, pulse, and                            oxygen saturations were monitored continuously. The                            Colonoscope was introduced through the anus and                            advanced to the the  terminal ileum, with                            identification of the appendiceal orifice and IC                            valve. The colonoscopy was technically difficult                            and complex due to poor endoscopic visualization.                            The patient tolerated the procedure well. The                            quality of the bowel preparation was inadequate,                            unable to suction due to frequent clogging of scope                            with fibrinous material and ?chewing gum. The                            terminal ileum, ileocecal valve, appendiceal                            orifice, and rectum were photographed. Scope In: 9:44:11 AM Scope Out: 10:02:16 AM Scope Withdrawal Time: 0 hours 11 minutes 20 seconds  Total Procedure Duration: 0 hours 18 minutes 5 seconds  Findings:                 The perianal and digital rectal examinations were                            normal.                           A 4 mm polyp was found in the appendiceal orifice.  The polyp was sessile. The polyp was removed with a                            cold snare. Resection and retrieval were complete.                           A 3 mm polyp was found in the transverse colon. The                            polyp was sessile. The polyp was removed with a                            cold biopsy forceps. Resection and retrieval were                            complete.                           Non-bleeding internal hemorrhoids were found during                            retroflexion. The hemorrhoids were small. Complications:            No immediate complications. Estimated Blood Loss:     Estimated blood loss was minimal. Impression:               - Preparation of the colon was inadequate.                           - One 4 mm polyp at the appendiceal orifice,                            removed with a cold snare. Resected  and retrieved.                           - One 3 mm polyp in the transverse colon, removed                            with a cold biopsy forceps. Resected and retrieved.                           - Non-bleeding internal hemorrhoids. Recommendation:           - Patient has a contact number available for                            emergencies. The signs and symptoms of potential                            delayed complications were discussed with the                            patient. Return to normal activities tomorrow.  Written discharge instructions were provided to the                            patient.                           - Resume previous diet.                           - Continue present medications.                           - Await pathology results.                           - Repeat colonoscopy at the next available                            appointment because the bowel preparation was                            suboptimal. Mauri Pole, MD 02/24/2017 10:08:34 AM This report has been signed electronically.

## 2017-02-24 NOTE — Progress Notes (Signed)
Called to room to assist during endoscopic procedure.  Patient ID and intended procedure confirmed with present staff. Received instructions for my participation in the procedure from the performing physician.  

## 2017-02-24 NOTE — Progress Notes (Signed)
Pt's states no medical or surgical changes since previsit  

## 2017-02-24 NOTE — Progress Notes (Signed)
Alert and oriented x3, pleased with MAC, report to RN jane 

## 2017-02-24 NOTE — Patient Instructions (Addendum)
YOU HAD AN ENDOSCOPIC PROCEDURE TODAY AT [facility]:   Refer to the procedure report that was given to you for any specific questions about what was found during the examination.  If the procedure report does not answer your questions, please call your gastroenterologist to clarify.  If you requested that your care partner not be given the details of your procedure findings, then the procedure report has been included in a sealed envelope for you to review at your convenience later.  YOU SHOULD EXPECT: Some feelings of bloating in the abdomen. Passage of more gas than usual.  Walking can help get rid of the air that was put into your GI tract during the procedure and reduce the bloating. If you had a lower endoscopy (such as a colonoscopy or flexible sigmoidoscopy) you may notice spotting of blood in your stool or on the toilet paper. If you underwent a bowel prep for your procedure, you may not have a normal bowel movement for a few days.  Please Note:  You might notice some irritation and congestion in your nose or some drainage.  This is from the oxygen used during your procedure.  There is no need for concern and it should clear up in a day or so.  SYMPTOMS TO REPORT IMMEDIATELY:   Following lower endoscopy (colonoscopy or flexible sigmoidoscopy):  Excessive amounts of blood in the stool  Significant tenderness or worsening of abdominal pains  Swelling of the abdomen that is new, acute  Fever of 100F or higher   Following upper endoscopy (EGD)  Vomiting of blood or coffee ground material  New chest pain or pain under the shoulder blades  Painful or persistently difficult swallowing  New shortness of breath  Fever of 100F or higher  Black, tarry-looking stools  For urgent or emergent issues, a gastroenterologist can be reached at any hour by calling [phone].   DIET:  We do recommend a small meal at first, but then you may proceed to your regular diet.  Drink  plenty of fluids but you should avoid alcoholic beverages for 24 hours.  ACTIVITY:  You should plan to take it easy for the rest of today and you should NOT DRIVE or use heavy machinery until tomorrow (because of the sedation medicines used during the test).    FOLLOW UP: Our staff will call the number listed on your records the next business day following your procedure to check on you and address any questions or concerns that you may have regarding the information given to you following your procedure. If we do not reach you, we will leave a message.  However, if you are feeling well and you are not experiencing any problems, there is no need to return our call.  We will assume that you have returned to your regular daily activities without incident.  If any biopsies were taken you will be contacted by phone or by letter within the next 1-3 weeks.  Please call us at [phone] if you have not heard about the biopsies in 3 weeks.    SIGNATURES/CONFIDENTIALITY: You and/or your care partner have signed paperwork which will be entered into your electronic medical record.  These signatures attest to the fact that that the information above on your After Visit Summary has been reviewed and is understood.  Full responsibility of the confidentiality of this discharge information lies with you and/or your care-partner.  Polyp and hemorrhoid information given.  2 day bowel prep, next  available appointment for colonoscopy.

## 2017-02-24 NOTE — Progress Notes (Signed)
Pt. Was asked if he'd like to reschedule colonoscopy.  He states he will call back to reschedule colonoscopy.

## 2017-02-25 ENCOUNTER — Telehealth: Payer: Self-pay | Admitting: *Deleted

## 2017-02-25 NOTE — Telephone Encounter (Signed)
  Follow up Call-  Call back number 02/24/2017  Post procedure Call Back phone  # [phone] cell  Permission to leave phone message Yes  Some recent data might be hidden     Patient questions:  Do you have a fever, pain , or abdominal swelling? No. Pain Score  0 *  Have you tolerated food without any problems? Yes.    Have you been able to return to your normal activities? Yes.    Do you have any questions about your discharge instructions: Diet   No. Medications  No. Follow up visit  No.  Do you have questions or concerns about your Care? No.  Actions: * If pain score is 4 or above: No action needed, pain <4.

## 2017-03-05 ENCOUNTER — Encounter: Payer: Self-pay | Admitting: Gastroenterology

## 2017-03-08 ENCOUNTER — Telehealth: Payer: Self-pay | Admitting: *Deleted

## 2017-03-08 NOTE — Telephone Encounter (Signed)
Spoke with pt re: setting up follow up colonoscopy d/t poor prep.  He states that he does not have time to have another procedure done at this time and asks if we could contact him in a few months to set up a follow up.  I put in a recall for 2 months so pt can be contacted to set up follow up.  Letter also sent out.

## 2017-03-30 DIAGNOSIS — M5416 Radiculopathy, lumbar region: Secondary | ICD-10-CM | POA: Diagnosis not present

## 2017-05-25 ENCOUNTER — Other Ambulatory Visit: Payer: Self-pay | Admitting: Internal Medicine

## 2017-05-25 NOTE — Telephone Encounter (Signed)
Pt is requesting refill on Ibuprofen 800mg . Last OV: 12/07/2016, last fill: 09/23/2016 #30 and 0RF. Please advise.

## 2017-05-25 NOTE — Telephone Encounter (Signed)
Rx sent 

## 2017-05-25 NOTE — Telephone Encounter (Signed)
Ok 30 and 1 RF

## 2017-07-20 ENCOUNTER — Other Ambulatory Visit: Payer: Self-pay | Admitting: Internal Medicine

## 2017-07-20 NOTE — Telephone Encounter (Signed)
Pt is requesting refill on ibuprofen 800mg - last fill 05/25/2017 #30 and 1 rf. Please advise.

## 2017-07-20 NOTE — Telephone Encounter (Signed)
Okay #30 and 5 refills 

## 2017-07-20 NOTE — Telephone Encounter (Signed)
Rx sent 

## 2017-08-20 ENCOUNTER — Encounter: Payer: Self-pay | Admitting: Gastroenterology

## 2017-10-21 ENCOUNTER — Other Ambulatory Visit: Payer: Self-pay | Admitting: Internal Medicine

## 2017-11-03 DIAGNOSIS — M5416 Radiculopathy, lumbar region: Secondary | ICD-10-CM | POA: Diagnosis not present

## 2017-12-05 ENCOUNTER — Other Ambulatory Visit: Payer: Self-pay | Admitting: Internal Medicine

## 2017-12-10 ENCOUNTER — Other Ambulatory Visit: Payer: Self-pay | Admitting: Internal Medicine

## 2018-03-05 ENCOUNTER — Other Ambulatory Visit: Payer: Self-pay | Admitting: Internal Medicine

## 2018-03-07 ENCOUNTER — Telehealth: Payer: Self-pay | Admitting: Internal Medicine

## 2018-03-07 ENCOUNTER — Telehealth: Payer: Self-pay

## 2018-03-07 NOTE — Telephone Encounter (Signed)
Pt schedule for 03-16-2018 at 11:00 am. Done

## 2018-03-07 NOTE — Telephone Encounter (Signed)
Pt is overdue for visit- last ov 11/2016- will need appt for refills.

## 2018-03-07 NOTE — Telephone Encounter (Signed)
PA initiated via Covermymeds; KEY: [id number]. Received real time PA approval.

## 2018-03-07 NOTE — Telephone Encounter (Signed)
Copied from [place] [id number]. Topic: Quick Communication - See Telephone Encounter >> Mar 07, 2018  4:00 PM Nils Flack wrote: CRM for notification. See Telephone encounter for: 03/07/18.  Pt needs a PA for tadalafil (CIALIS) 5 MG tablet.  He is supposed to be taking this med daily.  The last time he got 6 pills for a 25 day supply.   Cb [phone] [facility]

## 2018-03-08 ENCOUNTER — Other Ambulatory Visit: Payer: Self-pay | Admitting: Internal Medicine

## 2018-03-16 ENCOUNTER — Encounter: Payer: Self-pay | Admitting: Internal Medicine

## 2018-03-16 ENCOUNTER — Ambulatory Visit: Payer: BLUE CROSS/BLUE SHIELD | Admitting: Internal Medicine

## 2018-03-16 VITALS — BP 120/80 | HR 63 | Temp 98.2°F | Resp 14 | Ht 71.0 in | Wt 258.0 lb

## 2018-03-16 DIAGNOSIS — F528 Other sexual dysfunction not due to a substance or known physiological condition: Secondary | ICD-10-CM | POA: Diagnosis not present

## 2018-03-16 DIAGNOSIS — M5442 Lumbago with sciatica, left side: Secondary | ICD-10-CM

## 2018-03-16 DIAGNOSIS — K219 Gastro-esophageal reflux disease without esophagitis: Secondary | ICD-10-CM | POA: Diagnosis not present

## 2018-03-16 DIAGNOSIS — G8929 Other chronic pain: Secondary | ICD-10-CM

## 2018-03-16 LAB — CBC WITH DIFFERENTIAL/PLATELET
BASOS PCT: 1.5 % (ref 0.0–3.0)
Basophils Absolute: 0.1 10*3/uL (ref 0.0–0.1)
EOS PCT: 3.3 % (ref 0.0–5.0)
Eosinophils Absolute: 0.2 10*3/uL (ref 0.0–0.7)
HCT: 46 % (ref 39.0–52.0)
Hemoglobin: 15.7 g/dL (ref 13.0–17.0)
Lymphocytes Relative: 37.2 % (ref 12.0–46.0)
Lymphs Abs: 2.5 10*3/uL (ref 0.7–4.0)
MCHC: 34.2 g/dL (ref 30.0–36.0)
MCV: 89.4 fl (ref 78.0–100.0)
MONOS PCT: 7.5 % (ref 3.0–12.0)
Monocytes Absolute: 0.5 10*3/uL (ref 0.1–1.0)
NEUTROS PCT: 50.5 % (ref 43.0–77.0)
Neutro Abs: 3.4 10*3/uL (ref 1.4–7.7)
Platelets: 219 10*3/uL (ref 150.0–400.0)
RBC: 5.14 Mil/uL (ref 4.22–5.81)
RDW: 13.5 % (ref 11.5–15.5)
WBC: 6.7 10*3/uL (ref 4.0–10.5)

## 2018-03-16 LAB — BASIC METABOLIC PANEL
BUN: 21 mg/dL (ref 6–23)
CHLORIDE: 103 meq/L (ref 96–112)
CO2: 32 meq/L (ref 19–32)
Calcium: 9.3 mg/dL (ref 8.4–10.5)
Creatinine, Ser: 0.87 mg/dL (ref 0.40–1.50)
GFR: 97.81 mL/min (ref >60.00)
GLUCOSE: 95 mg/dL (ref 70–99)
POTASSIUM: 5 meq/L (ref 3.5–5.1)
SODIUM: 139 meq/L (ref 135–145)

## 2018-03-16 MED ORDER — CYCLOBENZAPRINE HCL 10 MG PO TABS: 10.0000 mg | ORAL_TABLET | Freq: Every evening | ORAL | 10 refills | Status: DC | PRN

## 2018-03-16 MED ORDER — ESOMEPRAZOLE MAGNESIUM 40 MG PO CPDR: 40.0000 mg | DELAYED_RELEASE_CAPSULE | Freq: Every day | ORAL | 3 refills | Status: DC

## 2018-03-16 MED ORDER — IBUPROFEN 800 MG PO TABS: 800.0000 mg | ORAL_TABLET | Freq: Two times a day (BID) | ORAL | 10 refills | Status: DC | PRN

## 2018-03-16 MED ORDER — FLUTICASONE PROPIONATE 50 MCG/ACT NA SUSP: 2.0000 | Freq: Every day | NASAL | 3 refills | Status: DC

## 2018-03-16 MED ORDER — LEVOCETIRIZINE DIHYDROCHLORIDE 5 MG PO TABS: 5.0000 mg | ORAL_TABLET | Freq: Every evening | ORAL | 3 refills | Status: DC

## 2018-03-16 MED ORDER — TADALAFIL 5 MG PO TABS: 5.0000 mg | ORAL_TABLET | Freq: Every day | ORAL | 10 refills | Status: DC

## 2018-03-16 NOTE — Progress Notes (Signed)
Pre visit review using our clinic review tool, if applicable. No additional management support is needed unless otherwise documented below in the visit note. 

## 2018-03-16 NOTE — Patient Instructions (Signed)
GO TO THE LAB : Get the blood work     GO TO THE FRONT DESK Schedule your next appointment for a physical exam, fasting in 8 to 10 months

## 2018-03-16 NOTE — Progress Notes (Signed)
Subjective:    Patient ID: Timothy [name], male    DOB: 08/13/1966, 52 y.o.   MRN: [id number]  DOS:  03/16/2018 Type of visit - description : f/u  Interval history: Back pain: Still takes ibuprofen daily. GERD: Symptoms control as long as he takes PPIs Needs multiple refills.   Review of Systems Denies chest pain or difficulty breathing No nausea, vomiting, diarrhea.  No blood in the stools. no abdominal pain  Past Medical History:  Diagnosis Date  . Allergic rhinitis   . Chest pain 04-09-2008   stress test normal and echo (NI), GB US negative had a previous eval by cards as well  . Erectile dysfunction   . GERD (gastroesophageal reflux disease)   . Hypertriglyceridemia   . Right groin pain 2016   Dr. Jeffie Pollock, plan is renal US, and cystoscopy  . Shingles 03-2009  . Spinal stenosis at L4-L5 level    moderate  . Spondylolisthesis    slight, across L4-5  . 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 FRACTURE SURGERY     left leg  . OTHER SURGICAL HISTORY     undescended testicle surgery, right  . VASECTOMY  2009    Social History   Socioeconomic History  . Marital status: Married    Spouse name: Not on file  . Number of children: 1  . Years of education: Not on file  . Highest education level: Not on file  Occupational History  . Occupation: controller  Social Needs  . Financial resource strain: Not on file  . Food insecurity:    Worry: Not on file    Inability: Not on file  . Transportation needs:    Medical: Not on file    Non-medical: Not on file  Tobacco Use  . Smoking status: Never Smoker  . Smokeless tobacco: Never Used  Substance and Sexual Activity  . Alcohol use: Yes    Alcohol/week: 1.8 oz    Types: 3 Cans of beer per week    Comment: socially   . Drug use: No  . Sexual activity: Not on file  Lifestyle  . Physical activity:    Days per week:  Not on file    Minutes per session: Not on file  . Stress: Not on file  Relationships  . Social connections:    Talks on phone: Not on file    Gets together: Not on file    Attends religious service: Not on file    Active member of club or organization: Not on file    Attends meetings of clubs or organizations: Not on file    Relationship status: Not on file  . Intimate partner violence:    Fear of current or ex partner: Not on file    Emotionally abused: Not on file    Physically abused: Not on file    Forced sexual activity: Not on file  Other Topics Concern  . Not on file  Social History Narrative   Child 1988   Divorced, has a steady girlfriend, got  married 01-2016      Allergies as of 03/16/2018      Reactions   Aspirin Rash   Decongest Rash   rash   Penicillins Rash      Medication List        Accurate as of 03/16/18 11:59 PM. Always use your  most recent med list.          cyclobenzaprine 10 MG tablet Commonly known as:  FLEXERIL Take 1 tablet (10 mg total) by mouth at bedtime as needed for muscle spasms.   esomeprazole 40 MG capsule Commonly known as:  NEXIUM Take 1 capsule (40 mg total) by mouth daily before breakfast.   fluticasone 50 MCG/ACT nasal spray Commonly known as:  FLONASE Place 2 sprays into both nostrils daily.   ibuprofen 800 MG tablet Commonly known as:  ADVIL,MOTRIN Take 1 tablet (800 mg total) by mouth 2 (two) times daily as needed for moderate pain.   levocetirizine 5 MG tablet Commonly known as:  XYZAL Take 1 tablet (5 mg total) by mouth every evening.   tadalafil 5 MG tablet Commonly known as:  CIALIS Take 1 tablet (5 mg total) by mouth daily.          Objective:   Physical Exam BP 120/80 (BP Location: Left Arm, Patient Position: Sitting, Cuff Size: Normal)   Pulse 63   Temp 98.2 F (36.8 C) (Oral)   Resp 14   Ht 5\' 11"  (1.803 m)   Wt 258 lb (117 kg)   SpO2 98%   BMI 35.98 kg/m  General:   Well developed, NAD,  see BMI.  HEENT:  Normocephalic . Face symmetric, atraumatic Lungs:  CTA B Normal respiratory effort, no intercostal retractions, no accessory muscle use. Heart: RRR,  no murmur.  No pretibial edema bilaterally  Skin: Not pale. Not jaundice Neurologic:  alert & oriented X3.  Speech normal, gait appropriate for age and unassisted Psych--  Cognition and judgment appear intact.  Cooperative with normal attention span and concentration.  Behavior appropriate. No anxious or depressed appearing.      Assessment & Plan:   Assessment Dyslipidemia GERD Chest pain 2009, normal echo/ stress test/ GB US Shingles 2010 GU: --Undescended testicle, R, s/p surgery -- UTI recurrent (third UTI dx 12-2014), groin pain >>>saw urology 02-2015-- rx [place] --Increased PSA 12-2014 in the context of a UTI, saw urology 02-2015, apparently elevated PSA not adressed MSK: chronic  back pain, onset ~2015, s/p local injection x 1 ~ 08-2014 ([facility]) Aspirin allergy (able to take ibuprofen regularly)  PLAN: Dyslipidemia: Diet controlled, last FLP satisfactory GERD: On daily PPIs, RF sent, symptoms control as long as he takes his medication.  Check a BMP and CBC Allergies: Refill Flonase and Xyzal Chronic back pain: He also has aches and pains at the knees.  Taking Flexeril as needed, take ibuprofen 800 mg every morning sometimes at night.  Refills sent.  Remind him to take ibuprofen with food, he is also aware of potential kidney damage, checking a BMP today. ED: On Cialis daily, send a refill. RTC CPX 10 months

## 2018-03-17 NOTE — Assessment & Plan Note (Signed)
Dyslipidemia: Diet controlled, last FLP satisfactory GERD: On daily PPIs, RF sent, symptoms control as long as he takes his medication.  Check a BMP and CBC Allergies: Refill Flonase and Xyzal Chronic back pain: He also has aches and pains at the knees.  Taking Flexeril as needed, take ibuprofen 800 mg every morning sometimes at night.  Refills sent.  Remind him to take ibuprofen with food, he is also aware of potential kidney damage, checking a BMP today. ED: On Cialis daily, send a refill. RTC CPX 10 months

## 2018-05-26 ENCOUNTER — Other Ambulatory Visit: Payer: Self-pay | Admitting: Internal Medicine

## 2018-05-26 MED ORDER — TADALAFIL 5 MG PO TABS: 5.0000 mg | ORAL_TABLET | Freq: Every day | ORAL | 1 refills | Status: DC

## 2018-05-26 MED ORDER — IBUPROFEN 800 MG PO TABS: 800.0000 mg | ORAL_TABLET | Freq: Two times a day (BID) | ORAL | 0 refills | Status: DC | PRN

## 2018-05-26 MED ORDER — CYCLOBENZAPRINE HCL 10 MG PO TABS: 10.0000 mg | ORAL_TABLET | Freq: Every evening | ORAL | 0 refills | Status: DC | PRN

## 2018-05-26 MED ORDER — LEVOCETIRIZINE DIHYDROCHLORIDE 5 MG PO TABS: 5.0000 mg | ORAL_TABLET | Freq: Every evening | ORAL | 3 refills | Status: DC

## 2018-05-26 MED ORDER — ESOMEPRAZOLE MAGNESIUM 40 MG PO CPDR: 40.0000 mg | DELAYED_RELEASE_CAPSULE | Freq: Every day | ORAL | 3 refills | Status: DC

## 2018-05-26 MED ORDER — FLUTICASONE PROPIONATE 50 MCG/ACT NA SUSP: 2.0000 | Freq: Every day | NASAL | 3 refills | Status: DC

## 2018-05-26 NOTE — Telephone Encounter (Signed)
Copied from [place] [phone]. Topic: Quick Communication - Rx Refill/Question >> May 26, 2018  1:38 PM Margot Ables wrote: Medication: following up on faxed request for refills on pt entire medlist - advised that all meds were sent to local [facility] - Darrick Meigs states pt wanting thru [facility]  Cyclobenzaprine, esomeprazole, fluticasone, ibuprofen, levocetirizine, tadalafil   Has the patient contacted their pharmacy? yes Preferred Pharmacy (with phone number or street name): [place], [place] - [place] [phone] (Phone) [phone] (Fax)

## 2018-05-26 NOTE — Telephone Encounter (Signed)
Pt using [facility]---rx's sent.

## 2018-05-26 NOTE — Telephone Encounter (Signed)
MyChart message sent to Pt to verify he is switching to [facility].

## 2018-05-31 ENCOUNTER — Other Ambulatory Visit: Payer: Self-pay | Admitting: Internal Medicine

## 2018-08-31 DIAGNOSIS — M5416 Radiculopathy, lumbar region: Secondary | ICD-10-CM | POA: Diagnosis not present

## 2018-10-25 ENCOUNTER — Encounter: Payer: Self-pay | Admitting: Internal Medicine

## 2018-10-25 ENCOUNTER — Ambulatory Visit: Payer: BLUE CROSS/BLUE SHIELD | Admitting: Internal Medicine

## 2018-10-25 VITALS — BP 124/66 | HR 66 | Temp 98.0°F | Resp 16 | Ht 71.0 in | Wt 262.0 lb

## 2018-10-25 DIAGNOSIS — M25562 Pain in left knee: Secondary | ICD-10-CM | POA: Diagnosis not present

## 2018-10-25 NOTE — Progress Notes (Signed)
Pre visit review using our clinic review tool, if applicable. No additional management support is needed unless otherwise documented below in the visit note. 

## 2018-10-25 NOTE — Progress Notes (Signed)
Subjective:    Patient ID: Timothy [name], male    DOB: January 25, 1966, 53 y.o.   MRN: [id number]  DOS:  10/25/2018 Type of visit - description: acute His concern today is left knee pain. 2 years ago, he had an accident when he missed a step and he felt a pop on the left knee. Since then he has on and off pain at the left knee. The pain is worse the weeks  he is active, there is days that he is pain-free.  It is typically located at the internal &  posterior aspect of the knee.   Review of Systems No swelling or redness per se  Past Medical History:  Diagnosis Date  . Allergic rhinitis   . Chest pain 04-09-2008   stress test normal and echo (NI), GB US negative had a previous eval by cards as well  . Erectile dysfunction   . GERD (gastroesophageal reflux disease)   . Hypertriglyceridemia   . Right groin pain 2016   Dr. Jeffie Pollock, plan is renal US, and cystoscopy  . Shingles 03-2009  . Spinal stenosis at L4-L5 level    moderate  . Spondylolisthesis    slight, across L4-5  . 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 FRACTURE SURGERY     left leg  . OTHER SURGICAL HISTORY     undescended testicle surgery, right  . VASECTOMY  2009    Social History   Socioeconomic History  . Marital status: Married    Spouse name: Not on file  . Number of children: 1  . Years of education: Not on file  . Highest education level: Not on file  Occupational History  . Occupation: controller  Social Needs  . Financial resource strain: Not on file  . Food insecurity:    Worry: Not on file    Inability: Not on file  . Transportation needs:    Medical: Not on file    Non-medical: Not on file  Tobacco Use  . Smoking status: Never Smoker  . Smokeless tobacco: Never Used  Substance and Sexual Activity  . Alcohol use: Yes    Alcohol/week: 3.0 standard drinks    Types: 3 Cans of beer per week     Comment: socially   . Drug use: No  . Sexual activity: Not on file  Lifestyle  . Physical activity:    Days per week: Not on file    Minutes per session: Not on file  . Stress: Not on file  Relationships  . Social connections:    Talks on phone: Not on file    Gets together: Not on file    Attends religious service: Not on file    Active member of club or organization: Not on file    Attends meetings of clubs or organizations: Not on file    Relationship status: Not on file  . Intimate partner violence:    Fear of current or ex partner: Not on file    Emotionally abused: Not on file    Physically abused: Not on file    Forced sexual activity: Not on file  Other Topics Concern  . Not on file  Social History Narrative   Child 1988   Divorced, has a steady girlfriend, got  married 01-2016      Allergies as of 10/25/2018      Reactions  Aspirin Rash   Decongest Rash   rash   Penicillins Rash      Medication List       Accurate as of October 25, 2018  4:07 PM. Always use your most recent med list.        cyclobenzaprine 10 MG tablet Commonly known as:  FLEXERIL Take 1 tablet (10 mg total) by mouth at bedtime as needed for muscle spasms.   esomeprazole 40 MG capsule Commonly known as:  NEXIUM Take 1 capsule (40 mg total) by mouth daily before breakfast.   fluticasone 50 MCG/ACT nasal spray Commonly known as:  FLONASE Place 2 sprays into both nostrils daily.   ibuprofen 800 MG tablet Commonly known as:  ADVIL,MOTRIN Take 1 tablet (800 mg total) by mouth 2 (two) times daily as needed for moderate pain.   levocetirizine 5 MG tablet Commonly known as:  XYZAL Take 1 tablet (5 mg total) by mouth every evening.   tadalafil 5 MG tablet Commonly known as:  CIALIS Take 1 tablet (5 mg total) by mouth daily.           Objective:   Physical Exam BP 124/66 (BP Location: Left Arm, Patient Position: Sitting, Cuff Size: Normal)   Pulse 66   Temp 98 F (36.7 C)  (Oral)   Resp 16   Ht 5\' 11"  (1.803 m)   Wt 262 lb (118.8 kg)   SpO2 98%   BMI 36.54 kg/m  General:   Well developed, NAD, BMI noted. HEENT:  Normocephalic . Face symmetric, atraumatic MSK: Right knee normal Left knee: No effusion, not tender, the joint is a stable.  Range of motion is normal Skin: Not pale. Not jaundice Neurologic:  alert & oriented X3.  Speech normal, gait appropriate for age and unassisted Psych--  Cognition and judgment appear intact.  Cooperative with normal attention span and concentration.  Behavior appropriate. No anxious or depressed appearing.      Assessment     Assessment Dyslipidemia GERD Chest pain 2009, normal echo/ stress test/ GB US Shingles 2010 GU: --Undescended testicle, R, s/p surgery -- UTI recurrent (third UTI dx 12-2014), groin pain >>>saw urology 02-2015-- rx [place] --Increased PSA 12-2014 in the context of a UTI, saw urology 02-2015, apparently elevated PSA not adressed MSK: chronic  back pain, onset ~2015, s/p local injection x 1 ~ 08-2014 ([facility]) Aspirin allergy (able to take ibuprofen regularly)  PLAN: Left knee pain: On and off, chronic, suspect internal derangement. Recommend as needed Tylenol, ibuprofen, icing. Refer to Ortho.  Meniscal tear? Others?

## 2018-10-26 NOTE — Assessment & Plan Note (Signed)
Left knee pain: On and off, chronic, suspect internal derangement. Recommend as needed Tylenol, ibuprofen, icing. Refer to Ortho.  Meniscal tear? Others?

## 2018-11-30 ENCOUNTER — Encounter: Payer: BLUE CROSS/BLUE SHIELD | Admitting: Internal Medicine

## 2019-02-01 DIAGNOSIS — M25562 Pain in left knee: Secondary | ICD-10-CM | POA: Diagnosis not present

## 2019-02-01 DIAGNOSIS — M238X2 Other internal derangements of left knee: Secondary | ICD-10-CM | POA: Diagnosis not present

## 2019-03-19 ENCOUNTER — Other Ambulatory Visit: Payer: Self-pay | Admitting: Internal Medicine

## 2019-05-08 ENCOUNTER — Other Ambulatory Visit: Payer: Self-pay | Admitting: Internal Medicine

## 2019-05-10 ENCOUNTER — Other Ambulatory Visit: Payer: Self-pay | Admitting: Internal Medicine

## 2019-06-11 ENCOUNTER — Other Ambulatory Visit: Payer: Self-pay | Admitting: Internal Medicine

## 2019-06-15 ENCOUNTER — Other Ambulatory Visit: Payer: Self-pay | Admitting: Internal Medicine

## 2019-06-15 NOTE — Telephone Encounter (Signed)
Cialis refilled for 30 days only. Pt overdue for visit- his last CPX was in 2018.

## 2019-06-26 ENCOUNTER — Encounter: Payer: Self-pay | Admitting: Internal Medicine

## 2019-06-26 ENCOUNTER — Ambulatory Visit (INDEPENDENT_AMBULATORY_CARE_PROVIDER_SITE_OTHER): Payer: BC Managed Care – PPO | Admitting: Internal Medicine

## 2019-06-26 ENCOUNTER — Other Ambulatory Visit: Payer: Self-pay

## 2019-06-26 VITALS — BP 126/82 | HR 72 | Temp 97.6°F | Resp 16 | Ht 71.0 in | Wt 264.2 lb

## 2019-06-26 DIAGNOSIS — M79642 Pain in left hand: Secondary | ICD-10-CM | POA: Diagnosis not present

## 2019-06-26 DIAGNOSIS — M79641 Pain in right hand: Secondary | ICD-10-CM | POA: Diagnosis not present

## 2019-06-26 DIAGNOSIS — R200 Anesthesia of skin: Secondary | ICD-10-CM

## 2019-06-26 MED ORDER — IBUPROFEN 800 MG PO TABS: 800.0000 mg | ORAL_TABLET | Freq: Two times a day (BID) | ORAL | 5 refills | Status: DC | PRN

## 2019-06-26 NOTE — Patient Instructions (Signed)
See you soon for your physical  Use the splinters at night

## 2019-06-26 NOTE — Progress Notes (Signed)
Subjective:    Patient ID: Timothy [name], male    DOB: [birth year], 53 y.o.   MRN: [id number]  DOS:  06/26/2019 Type of visit - description: Acute Having problems with his hands and wrists since April 2020. Complaining of pain and numbness at the hands, at both the ulnar and radial aspect of the hands.  Pain is worse at the base of the thumb. Also pain with extension of the wrist. All symptoms are worse on the right.  He does use his hands a lot, doing home chores, riding his motorbike and he spends hours in the computer using a mouse.  Requests a refill on ibuprofen, he takes it twice a day without apparent GI side effects  Wt Readings from Last 3 Encounters:  06/26/19 264 lb 4 oz (119.9 kg)  10/25/18 262 lb (118.8 kg)  03/16/18 258 lb (117 kg)     Review of Systems Denies actual swelling of the wrist or hands No neck pain Shoulder pain: Chronic issue, no worse or better Denies  nausea, vomiting.  No blood in the stools, no change in the color of the stools  Past Medical History:  Diagnosis Date  . Allergic rhinitis   . Chest pain 04-09-2008   stress test normal and echo (NI), GB US negative had a previous eval by cards as well  . Erectile dysfunction   . GERD (gastroesophageal reflux disease)   . Hypertriglyceridemia   . Right groin pain 2016   Dr. Jeffie Pollock, plan is renal US, and cystoscopy  . Shingles 03-2009  . Spinal stenosis at L4-L5 level    moderate  . Spondylolisthesis    slight, across L4-5  . 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 FRACTURE SURGERY     left leg  . OTHER SURGICAL HISTORY     undescended testicle surgery, right  . VASECTOMY  2009    Social History   Socioeconomic History  . Marital status: Married    Spouse name: Not on file  . Number of children: 1  . Years of education: Not on file  . Highest education level: Not on file   Occupational History  . Occupation: controller  Social Needs  . Financial resource strain: Not on file  . Food insecurity    Worry: Not on file    Inability: Not on file  . Transportation needs    Medical: Not on file    Non-medical: Not on file  Tobacco Use  . Smoking status: Never Smoker  . Smokeless tobacco: Never Used  Substance and Sexual Activity  . Alcohol use: Yes    Alcohol/week: 3.0 standard drinks    Types: 3 Cans of beer per week    Comment: socially   . Drug use: No  . Sexual activity: Not on file  Lifestyle  . Physical activity    Days per week: Not on file    Minutes per session: Not on file  . Stress: Not on file  Relationships  . Social Herbalist on phone: Not on file    Gets together: Not on file    Attends religious service: Not on file    Active member of club or organization: Not on file    Attends meetings of clubs or organizations: Not on file    Relationship status: Not on file  . Intimate partner  violence    Fear of current or ex partner: Not on file    Emotionally abused: Not on file    Physically abused: Not on file    Forced sexual activity: Not on file  Other Topics Concern  . Not on file  Social History Narrative   Child 89   Divorced, has a steady girlfriend, got  married 01-2016      Allergies as of 06/26/2019      Reactions   Aspirin Rash   Decongest Rash   rash   Penicillins Rash      Medication List       Accurate as of June 26, 2019  2:20 PM. If you have any questions, ask your nurse or doctor.        cyclobenzaprine 10 MG tablet Commonly known as: FLEXERIL Take 1 tablet (10 mg total) by mouth at bedtime as needed for muscle spasms.   esomeprazole 40 MG capsule Commonly known as: NEXIUM Take 1 capsule (40 mg total) by mouth daily before breakfast.   fluticasone 50 MCG/ACT nasal spray Commonly known as: FLONASE Place 2 sprays into both nostrils daily.   ibuprofen 800 MG tablet Commonly known  as: ADVIL Take 1 tablet (800 mg total) by mouth 2 (two) times daily as needed for moderate pain.   levocetirizine 5 MG tablet Commonly known as: XYZAL Take 1 tablet (5 mg total) by mouth every morning.   tadalafil 5 MG tablet Commonly known as: CIALIS Take 1 tablet (5 mg total) by mouth daily.           Objective:   Physical Exam BP 126/82 (BP Location: Left Arm, Patient Position: Sitting, Cuff Size: Normal)   Pulse 72   Temp 97.6 F (36.4 C) (Temporal)   Resp 16   Ht 5\' 11"  (1.803 m)   Wt 264 lb 4 oz (119.9 kg)   SpO2 98%   BMI 36.86 kg/m  General:   Well developed, NAD, BMI noted. HEENT:  Normocephalic . Face symmetric, atraumatic  MSK: Hands and wrists: No synovitis on exam.  Mild bony changes consistent with DJD. Hyperextension of both wrist did not cause symptoms. Elbows: No TTP, no swelling. DTRs: Symmetric, somewhat decreased. Skin: Not pale. Not jaundice Neurologic:  alert & oriented X3.  Speech normal, gait appropriate for age and unassisted Psych--  Cognition and judgment appear intact.  Cooperative with normal attention span and concentration.  Behavior appropriate. No anxious or depressed appearing.      Assessment     Assessment Dyslipidemia GERD Chest pain 2009, normal echo/ stress test/ GB US Shingles 2010 GU: --Undescended testicle, R, s/p surgery -- UTI recurrent (third UTI dx 12-2014), groin pain >>>saw urology 02-2015-- rx [place] --Increased PSA 12-2014 in the context of a UTI, saw urology 02-2015, apparently elevated PSA not adressed MSK: chronic  back pain, onset ~2015, s/p local injection x 1 ~ 08-2014 ([facility]) Aspirin allergy (able to take ibuprofen regularly)  PLAN: Hand and wrist pain and numbness: As described above, could be CTS and also UTS. He got a right wrist splinter, used it  yesterday throughout the day and helped to some extent t but is causing difficulties using the mouse and computer. Plan: Refer to hand surgery, may need  further intervention.  Use the splinters at night, bilaterally and consistently.  When he comes back will need a TSH. RTC already scheduled for CPX soon.

## 2019-06-26 NOTE — Progress Notes (Signed)
Pre visit review using our clinic review tool, if applicable. No additional management support is needed unless otherwise documented below in the visit note. 

## 2019-06-27 NOTE — Assessment & Plan Note (Signed)
Hand and wrist pain and numbness: As described above, could be CTS and also UTS. He got a right wrist splinter, used it  yesterday throughout the day and helped to some extent t but is causing difficulties using the mouse and computer. Plan: Refer to hand surgery, may need further intervention.  Use the splinters at night, bilaterally and consistently.  When he comes back will need a TSH. RTC already scheduled for CPX soon.

## 2019-07-04 DIAGNOSIS — M5416 Radiculopathy, lumbar region: Secondary | ICD-10-CM | POA: Diagnosis not present

## 2019-07-11 DIAGNOSIS — G5601 Carpal tunnel syndrome, right upper limb: Secondary | ICD-10-CM | POA: Diagnosis not present

## 2019-07-11 DIAGNOSIS — G5602 Carpal tunnel syndrome, left upper limb: Secondary | ICD-10-CM | POA: Diagnosis not present

## 2019-07-17 ENCOUNTER — Other Ambulatory Visit: Payer: Self-pay

## 2019-07-17 ENCOUNTER — Encounter: Payer: Self-pay | Admitting: Internal Medicine

## 2019-07-17 ENCOUNTER — Ambulatory Visit (INDEPENDENT_AMBULATORY_CARE_PROVIDER_SITE_OTHER): Payer: BC Managed Care – PPO | Admitting: Internal Medicine

## 2019-07-17 VITALS — BP 123/78 | Temp 97.4°F | Resp 16 | Ht 71.0 in | Wt 265.5 lb

## 2019-07-17 DIAGNOSIS — G5603 Carpal tunnel syndrome, bilateral upper limbs: Secondary | ICD-10-CM | POA: Insufficient documentation

## 2019-07-17 DIAGNOSIS — Z0000 Encounter for general adult medical examination without abnormal findings: Secondary | ICD-10-CM | POA: Diagnosis not present

## 2019-07-17 DIAGNOSIS — Z125 Encounter for screening for malignant neoplasm of prostate: Secondary | ICD-10-CM | POA: Diagnosis not present

## 2019-07-17 DIAGNOSIS — E781 Pure hyperglyceridemia: Secondary | ICD-10-CM

## 2019-07-17 LAB — COMPREHENSIVE METABOLIC PANEL
ALT: 19 U/L (ref 0–53)
AST: 17 U/L (ref 0–37)
Albumin: 4 g/dL (ref 3.5–5.2)
Alkaline Phosphatase: 44 U/L (ref 39–117)
BUN: 16 mg/dL (ref 6–23)
CO2: 26 mEq/L (ref 19–32)
Calcium: 8.5 mg/dL (ref 8.4–10.5)
Chloride: 106 mEq/L (ref 96–112)
Creatinine, Ser: 0.82 mg/dL (ref 0.40–1.50)
GFR: 98.03 mL/min (ref >60.00)
Glucose, Bld: 94 mg/dL (ref 70–99)
Potassium: 3.9 mEq/L (ref 3.5–5.1)
Sodium: 138 mEq/L (ref 135–145)
Total Bilirubin: 0.7 mg/dL (ref 0.2–1.2)
Total Protein: 6.2 g/dL (ref 6.0–8.3)

## 2019-07-17 LAB — CBC WITH DIFFERENTIAL/PLATELET
Basophils Absolute: 0.1 10*3/uL (ref 0.0–0.1)
Basophils Relative: 1 % (ref 0.0–3.0)
Eosinophils Absolute: 0.1 10*3/uL (ref 0.0–0.7)
Eosinophils Relative: 1.9 % (ref 0.0–5.0)
HCT: 43.7 % (ref 39.0–52.0)
Hemoglobin: 14.9 g/dL (ref 13.0–17.0)
Lymphocytes Relative: 35.5 % (ref 12.0–46.0)
Lymphs Abs: 2.6 10*3/uL (ref 0.7–4.0)
MCHC: 34 g/dL (ref 30.0–36.0)
MCV: 87.1 fl (ref 78.0–100.0)
Monocytes Absolute: 0.5 10*3/uL (ref 0.1–1.0)
Monocytes Relative: 7.2 % (ref 3.0–12.0)
Neutro Abs: 4 10*3/uL (ref 1.4–7.7)
Neutrophils Relative %: 54.4 % (ref 43.0–77.0)
Platelets: 231 10*3/uL (ref 150.0–400.0)
RBC: 5.02 Mil/uL (ref 4.22–5.81)
RDW: 13.1 % (ref 11.5–15.5)
WBC: 7.4 10*3/uL (ref 4.0–10.5)

## 2019-07-17 LAB — LIPID PANEL
Cholesterol: 160 mg/dL (ref 0–200)
HDL: 34.1 mg/dL — ABNORMAL LOW (ref >39.00)
LDL Cholesterol: 96 mg/dL (ref 0–99)
NonHDL: 125.41
Total CHOL/HDL Ratio: 5
Triglycerides: 147 mg/dL (ref 0.0–149.0)
VLDL: 29.4 mg/dL (ref 0.0–40.0)

## 2019-07-17 LAB — TSH: TSH: 0.56 u[IU]/mL (ref 0.35–4.50)

## 2019-07-17 LAB — PSA: PSA: 1.34 ng/mL (ref 0.10–4.00)

## 2019-07-17 NOTE — Progress Notes (Signed)
Subjective:    Patient ID: Timothy [name], male    DOB: [birth year], 53 y.o.   MRN: [id number]  DOS:  07/17/2019 Type of visit - description: CPX In general feeling well. C/o difficulty with erections. Occasional snoring but denies lack of energy or feeling excessively sleepy. Denies muscle wasting.  Review of Systems  Other than above, a 14 point review of systems is negative   Past Medical History:  Diagnosis Date  . Allergic rhinitis   . Chest pain 04-09-2008   stress test normal and echo (NI), GB US negative had a previous eval by cards as well  . Erectile dysfunction   . GERD (gastroesophageal reflux disease)   . Hypertriglyceridemia   . Right groin pain 2016   Dr. Jeffie Pollock, plan is renal US, and cystoscopy  . Shingles 03-2009  . Spinal stenosis at L4-L5 level    moderate  . Spondylolisthesis    slight, across L4-5  . 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 FRACTURE SURGERY     left leg  . OTHER SURGICAL HISTORY     undescended testicle surgery, right  . VASECTOMY  2009    Social History   Socioeconomic History  . Marital status: Married    Spouse name: Not on file  . Number of children: 1  . Years of education: Not on file  . Highest education level: Not on file  Occupational History  . Occupation: controller  Social Needs  . Financial resource strain: Not on file  . Food insecurity    Worry: Not on file    Inability: Not on file  . Transportation needs    Medical: Not on file    Non-medical: Not on file  Tobacco Use  . Smoking status: Never Smoker  . Smokeless tobacco: Never Used  Substance and Sexual Activity  . Alcohol use: Yes    Alcohol/week: 3.0 standard drinks    Types: 3 Cans of beer per week    Comment: socially   . Drug use: No  . Sexual activity: Not on file  Lifestyle  . Physical activity    Days per week: Not on file    Minutes  per session: Not on file  . Stress: Not on file  Relationships  . Social Herbalist on phone: Not on file    Gets together: Not on file    Attends religious service: Not on file    Active member of club or organization: Not on file    Attends meetings of clubs or organizations: Not on file    Relationship status: Not on file  . Intimate partner violence    Fear of current or ex partner: Not on file    Emotionally abused: Not on file    Physically abused: Not on file    Forced sexual activity: Not on file  Other Topics Concern  . Not on file  Social History Narrative   Child 1988   Divorced, has a steady girlfriend, got  married 01-2016     Family History  Problem Relation Age of Onset  . Asthma Mother   . Allergies Mother   . Lung cancer Mother   . Diabetes Father   . Cancer Brother        bone   . Breast cancer Sister   . Colon cancer Neg Hx   .  Prostate cancer Neg Hx   . CAD Neg Hx      Allergies as of 07/17/2019      Reactions   Aspirin Rash   Decongest Rash   rash   Penicillins Rash      Medication List       Accurate as of July 17, 2019 11:59 PM. If you have any questions, ask your nurse or doctor.        esomeprazole 40 MG capsule Commonly known as: NEXIUM Take 1 capsule (40 mg total) by mouth daily before breakfast.   fluticasone 50 MCG/ACT nasal spray Commonly known as: FLONASE Place 2 sprays into both nostrils daily.   ibuprofen 800 MG tablet Commonly known as: ADVIL Take 1 tablet (800 mg total) by mouth 2 (two) times daily as needed for moderate pain.   levocetirizine 5 MG tablet Commonly known as: XYZAL Take 1 tablet (5 mg total) by mouth every morning.   tadalafil 5 MG tablet Commonly known as: CIALIS Take 1 tablet (5 mg total) by mouth daily.           Objective:   Physical Exam BP 123/78 (BP Location: Left Arm, Patient Position: Sitting, Cuff Size: Normal)   Temp (!) 97.4 F (36.3 C) (Temporal)   Resp 16    Ht 5\' 11"  (1.803 m)   Wt 265 lb 8 oz (120.4 kg)   SpO2 99%   BMI 37.03 kg/m  General: Well developed, NAD, BMI noted Neck: No  thyromegaly  HEENT:  Normocephalic . Face symmetric, atraumatic Lungs:  CTA B Normal respiratory effort, no intercostal retractions, no accessory muscle use. Heart: RRR,  no murmur.  No pretibial edema bilaterally  Abdomen:  Not distended, soft, non-tender. No rebound or rigidity.   Skin: Exposed areas without rash. Not pale. Not jaundice  GU: Penis normal, scrotum: Has a single, normal/nontender left testicle. Palpation of the right groin: No mass. Mild TTP. DRE: Normal prostate, brown stools. Neurologic:  alert & oriented X3.  Speech normal, gait appropriate for age and unassisted Strength symmetric and appropriate for age.  Psych: Cognition and judgment appear intact.  Cooperative with normal attention span and concentration.  Behavior appropriate. No anxious or depressed appearing.     Assessment      Assessment Dyslipidemia GERD Chest pain 2009, normal echo/ stress test/ GB US Shingles 2010 ED  GU: --Undescended testicle, R, s/p unsuccessful surgery. -- UTI recurrent (third UTI dx 12-2014), groin pain >>>saw urology 02-2015-- rx [place] --Increased PSA 12-2014 in the context of a UTI, saw urology 02-2015, apparently elevated PSA not adressed MSK: chronic  back pain, onset ~2015, s/p local injection x 1 ~ 08-2014 ([facility]) Aspirin allergy (able to take ibuprofen regularly)  PLAN: Here for CPX Dyslipidemia: Diet controlled.  Checking labs. ED: On daily Cialis, still having difficulty with erections but able to penetrate.  Slightly decreased libido.  In the past free testosterone x2 were normal.  Offered urology referral, MUSE?, Declined it for now, will call if ready. Snoring: Without lack of energy or feeling sleepy. Reassess yearly Hand and wrist pain and numbness: Saw Ortho, pending NCS RTC 1 year    This visit occurred during the  SARS-CoV-2 public health emergency.  Safety protocols were in place, including screening questions prior to the visit, additional usage of staff PPE, and extensive cleaning of exam room while observing appropriate contact time as indicated for disinfecting solutions.

## 2019-07-17 NOTE — Patient Instructions (Signed)
GO TO THE LAB : Get the blood work     GO TO THE FRONT DESK Schedule your next appointment   for a checkup in 1 year

## 2019-07-17 NOTE — Progress Notes (Signed)
Pre visit review using our clinic review tool, if applicable. No additional management support is needed unless otherwise documented below in the visit note. 

## 2019-07-18 ENCOUNTER — Encounter: Payer: Self-pay | Admitting: Internal Medicine

## 2019-07-18 NOTE — Assessment & Plan Note (Signed)
Here for CPX Dyslipidemia: Diet controlled.  Checking labs. ED: On daily Cialis, still having difficulty with erections but able to penetrate.  Slightly decreased libido.  In the past free testosterone x2 were normal.  Offered urology referral, MUSE?, Declined it for now, will call if ready. Snoring: Without lack of energy or feeling sleepy. Reassess yearly Hand and wrist pain and numbness: Saw Ortho, pending NCS RTC 1 year

## 2019-07-18 NOTE — Assessment & Plan Note (Signed)
-   Td 2017 -Has declined flu shot consistently --CCS:  cscope 06/2017, next per GI --Prostate cancer screening:  DRE negative, check a PSA --counseled about diet-exercise --labs : CMP, FLP, CBC, PSA, TSH

## 2019-07-21 ENCOUNTER — Other Ambulatory Visit: Payer: Self-pay | Admitting: Internal Medicine

## 2019-07-21 MED ORDER — ESOMEPRAZOLE MAGNESIUM 40 MG PO CPDR: 40.0000 mg | DELAYED_RELEASE_CAPSULE | Freq: Every day | ORAL | 3 refills | Status: DC

## 2019-07-21 MED ORDER — LEVOCETIRIZINE DIHYDROCHLORIDE 5 MG PO TABS: 5.0000 mg | ORAL_TABLET | Freq: Every morning | ORAL | 3 refills | Status: DC

## 2019-07-24 DIAGNOSIS — G5603 Carpal tunnel syndrome, bilateral upper limbs: Secondary | ICD-10-CM | POA: Diagnosis not present

## 2019-07-28 DIAGNOSIS — G5603 Carpal tunnel syndrome, bilateral upper limbs: Secondary | ICD-10-CM | POA: Diagnosis not present

## 2019-08-29 DIAGNOSIS — G5603 Carpal tunnel syndrome, bilateral upper limbs: Secondary | ICD-10-CM | POA: Diagnosis not present

## 2019-08-29 DIAGNOSIS — G5602 Carpal tunnel syndrome, left upper limb: Secondary | ICD-10-CM | POA: Diagnosis not present

## 2019-09-12 DIAGNOSIS — G5602 Carpal tunnel syndrome, left upper limb: Secondary | ICD-10-CM | POA: Diagnosis not present

## 2019-09-12 DIAGNOSIS — M542 Cervicalgia: Secondary | ICD-10-CM | POA: Diagnosis not present

## 2019-09-12 DIAGNOSIS — M4722 Other spondylosis with radiculopathy, cervical region: Secondary | ICD-10-CM | POA: Diagnosis not present

## 2019-09-16 DIAGNOSIS — M542 Cervicalgia: Secondary | ICD-10-CM | POA: Diagnosis not present

## 2019-09-26 DIAGNOSIS — M5412 Radiculopathy, cervical region: Secondary | ICD-10-CM | POA: Diagnosis not present

## 2019-10-20 DIAGNOSIS — G894 Chronic pain syndrome: Secondary | ICD-10-CM | POA: Diagnosis not present

## 2019-10-20 DIAGNOSIS — M5030 Other cervical disc degeneration, unspecified cervical region: Secondary | ICD-10-CM | POA: Diagnosis not present

## 2019-11-06 DIAGNOSIS — G894 Chronic pain syndrome: Secondary | ICD-10-CM | POA: Insufficient documentation

## 2020-02-15 ENCOUNTER — Other Ambulatory Visit: Payer: Self-pay | Admitting: Internal Medicine

## 2020-06-06 ENCOUNTER — Other Ambulatory Visit: Payer: Self-pay | Admitting: Internal Medicine

## 2020-06-13 DIAGNOSIS — M5416 Radiculopathy, lumbar region: Secondary | ICD-10-CM | POA: Diagnosis not present

## 2020-08-03 ENCOUNTER — Other Ambulatory Visit: Payer: Self-pay | Admitting: Internal Medicine

## 2020-08-31 ENCOUNTER — Other Ambulatory Visit: Payer: Self-pay | Admitting: Internal Medicine

## 2020-09-06 ENCOUNTER — Other Ambulatory Visit: Payer: Self-pay | Admitting: Internal Medicine

## 2020-09-24 ENCOUNTER — Encounter: Payer: Self-pay | Admitting: Internal Medicine

## 2020-09-24 ENCOUNTER — Other Ambulatory Visit: Payer: Self-pay

## 2020-09-24 ENCOUNTER — Other Ambulatory Visit: Payer: Self-pay | Admitting: Internal Medicine

## 2020-09-24 ENCOUNTER — Ambulatory Visit (INDEPENDENT_AMBULATORY_CARE_PROVIDER_SITE_OTHER): Payer: BC Managed Care – PPO | Admitting: Internal Medicine

## 2020-09-24 ENCOUNTER — Telehealth: Payer: Self-pay

## 2020-09-24 VITALS — BP 145/87 | HR 79 | Temp 98.2°F | Resp 16 | Ht 71.0 in | Wt 261.0 lb

## 2020-09-24 DIAGNOSIS — Z7185 Encounter for immunization safety counseling: Secondary | ICD-10-CM

## 2020-09-24 DIAGNOSIS — E669 Obesity, unspecified: Secondary | ICD-10-CM

## 2020-09-24 DIAGNOSIS — F528 Other sexual dysfunction not due to a substance or known physiological condition: Secondary | ICD-10-CM

## 2020-09-24 DIAGNOSIS — M5442 Lumbago with sciatica, left side: Secondary | ICD-10-CM | POA: Diagnosis not present

## 2020-09-24 DIAGNOSIS — K219 Gastro-esophageal reflux disease without esophagitis: Secondary | ICD-10-CM | POA: Diagnosis not present

## 2020-09-24 DIAGNOSIS — G8929 Other chronic pain: Secondary | ICD-10-CM

## 2020-09-24 MED ORDER — TADALAFIL 5 MG PO TABS: 5.0000 mg | ORAL_TABLET | Freq: Every day | ORAL | 1 refills | Status: DC

## 2020-09-24 MED ORDER — IBUPROFEN 800 MG PO TABS: 800.0000 mg | ORAL_TABLET | Freq: Two times a day (BID) | ORAL | 5 refills | Status: DC | PRN

## 2020-09-24 NOTE — Telephone Encounter (Signed)
Pt has 2 pharmacy benefit plans: [employer] below   PA initiated via Covermymeds; KEY: [id number]. PA cancelled by plan.    This medication or product is on your plan's list of covered drugs. Prior authorization is not required at this time. If your pharmacy has questions regarding the processing of your prescription, please have them call the [employer] pharmacy help desk at ([phone]. **Please note: Formulary lowering, tiering exception, cost reduction and/or pre-benefit determination review (including prospective Medicare hospice reviews) requests cannot be requested using this method of submission. Please contact us at [phone] instead.  PA w/ [employer] started: KEY: [id number]. Awaiting determination.

## 2020-09-24 NOTE — Progress Notes (Signed)
Pre visit review using our clinic review tool, if applicable. No additional management support is needed unless otherwise documented below in the visit note. 

## 2020-09-24 NOTE — Progress Notes (Signed)
Subjective:    Patient ID: Timothy [name], male    DOB: 1966-01-03, 55 y.o.   MRN: [id number]  DOS:  09/24/2020 Type of visit - description: Last office visit 06-2019, please review.  In general feels well. He did have a COVID the first week of January.  He feels he is completely recuperated. BMI continue to be elevated.  Wt Readings from Last 3 Encounters:  09/24/20 261 lb (118.4 kg)  07/17/19 265 lb 8 oz (120.4 kg)  06/26/19 264 lb 4 oz (119.9 kg)    Review of Systems See above   Past Medical History:  Diagnosis Date  . Allergic rhinitis   . Chest pain 04-09-2008   stress test normal and echo (NI), GB US negative had a previous eval by cards as well  . Erectile dysfunction   . GERD (gastroesophageal reflux disease)   . Hypertriglyceridemia   . Right groin pain 2016   Dr. Jeffie Pollock, plan is renal US, and cystoscopy  . Shingles 03-2009  . Spinal stenosis at L4-L5 level    moderate  . Spondylolisthesis    slight, across L4-5  . 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 FRACTURE SURGERY     left leg  . OTHER SURGICAL HISTORY     undescended testicle surgery, right  . VASECTOMY  2009    Allergies as of 09/24/2020      Reactions   Aspirin Rash   Decongest Rash   rash   Penicillins Rash      Medication List       Accurate as of September 24, 2020 11:59 PM. If you have any questions, ask your nurse or doctor.        esomeprazole 40 MG capsule Commonly known as: NEXIUM TAKE 1 CAPSULE BY MOUTH EVERY DAY BEFORE BREAKFAST   fluticasone 50 MCG/ACT nasal spray Commonly known as: FLONASE Place 2 sprays into both nostrils daily.   ibuprofen 800 MG tablet Commonly known as: ADVIL Take 1 tablet (800 mg total) by mouth 2 (two) times daily as needed for moderate pain.   levocetirizine 5 MG tablet Commonly known as: XYZAL Take 1 tablet (5 mg total) by mouth every  morning.   tadalafil 5 MG tablet Commonly known as: CIALIS Take 1 tablet (5 mg total) by mouth daily.          Objective:   Physical Exam BP (!) 145/87 (BP Location: Left Arm, Patient Position: Sitting, Cuff Size: Normal)   Pulse 79   Temp 98.2 F (36.8 C) (Oral)   Resp 16   Ht 5\' 11"  (1.803 m)   Wt 261 lb (118.4 kg)   SpO2 97%   BMI 36.40 kg/m  General:   Well developed, NAD, BMI noted. HEENT:  Normocephalic . Face symmetric, atraumatic Lungs:  CTA B Normal respiratory effort, no intercostal retractions, no accessory muscle use. Heart: RRR,  no murmur.  Lower extremities: no pretibial edema bilaterally  Skin: Not pale. Not jaundice Neurologic:  alert & oriented X3.  Speech normal, gait appropriate for age and unassisted Psych--  Cognition and judgment appear intact.  Cooperative with normal attention span and concentration.  Behavior appropriate. No anxious or depressed appearing.      Assessment      Assessment Dyslipidemia GERD Chest pain 2009, normal echo/ stress test/ GB US Shingles 2010 ED  GU: --Undescended testicle, R,  s/p unsuccessful surgery. -- UTI recurrent (third UTI dx 12-2014), groin pain >>>saw urology 02-2015-- rx [place] --Increased PSA 12-2014 in the context of a UTI, saw urology 02-2015, apparently elevated PSA not adressed MSK: chronic  back pain, onset ~2015, s/p local injection x 1 ~ 08-2014 ([facility]) Aspirin allergy (able to take ibuprofen regularly) COVID-19 DX 08-2020.  PLAN: GERD: Well-controlled on PPIs, sending a refill. ED: Well-controlled on daily Cialis, sending a refill Chronic back pain: Well-controlled as long as he takes ibuprofen 800 mg twice daily.  Advised about risk of long-term NSAIDs including kidney problems, PUD, HTN etc.  Ibuprofen really does the job for him as far as the back pain is concerned, encouraged to try to replace some of the ibuprofen but Tylenol but for now I sent the prescription. Allergies:  Well-controlled, refile seasonal Obesity: BMI 36, eats healthy on and off, weight varies throughout the year.  We had a long conversation about different options including weight watchers, calorie counting etc. COVID-19: DX first week 08/2020, symptoms were mild, no antivirals, no monoclonal antibodies or admission.  Previously he had 2 vaccines, recommend to proceed with a booster. RTC 4 to 6 months CPX   This visit occurred during the SARS-CoV-2 public health emergency.  Safety protocols were in place, including screening questions prior to the visit, additional usage of staff PPE, and extensive cleaning of exam room while observing appropriate contact time as indicated for disinfecting solutions.

## 2020-09-24 NOTE — Patient Instructions (Addendum)
   GO TO THE FRONT DESK, PLEASE SCHEDULE YOUR APPOINTMENTS Come back for   A physical in 4 to 6 months

## 2020-09-25 DIAGNOSIS — E669 Obesity, unspecified: Secondary | ICD-10-CM | POA: Insufficient documentation

## 2020-09-25 NOTE — Telephone Encounter (Signed)
PA for [employer] cancelled by plan. See below.   Your PA has been resolved, no additional PA is required. For further inquiries please contact the number on the back of the member prescription card. (Message [phone])

## 2020-09-25 NOTE — Assessment & Plan Note (Signed)
GERD: Well-controlled on PPIs, sending a refill. ED: Well-controlled on daily Cialis, sending a refill Chronic back pain: Well-controlled as long as he takes ibuprofen 800 mg twice daily.  Advised about risk of long-term NSAIDs including kidney problems, PUD, HTN etc.  Ibuprofen really does the job for him as far as the back pain is concerned, encouraged to try to replace some of the ibuprofen but Tylenol but for now I sent the prescription. Allergies: Well-controlled, refile seasonal Obesity: BMI 36, eats healthy on and off, weight varies throughout the year.  We had a long conversation about different options including weight watchers, calorie counting etc. COVID-19: DX first week 08/2020, symptoms were mild, no antivirals, no monoclonal antibodies or admission.  Previously he had 2 vaccines, recommend to proceed with a booster. RTC 4 to 6 months CPX

## 2020-11-25 ENCOUNTER — Other Ambulatory Visit: Payer: Self-pay | Admitting: Internal Medicine

## 2021-02-24 ENCOUNTER — Encounter: Payer: Self-pay | Admitting: Internal Medicine

## 2021-02-24 ENCOUNTER — Ambulatory Visit (INDEPENDENT_AMBULATORY_CARE_PROVIDER_SITE_OTHER): Payer: BC Managed Care – PPO | Admitting: Internal Medicine

## 2021-02-24 ENCOUNTER — Other Ambulatory Visit: Payer: Self-pay

## 2021-02-24 VITALS — BP 116/72 | HR 72 | Temp 98.1°F | Resp 16 | Ht 71.0 in | Wt 267.2 lb

## 2021-02-24 DIAGNOSIS — Z0001 Encounter for general adult medical examination with abnormal findings: Secondary | ICD-10-CM

## 2021-02-24 DIAGNOSIS — R0789 Other chest pain: Secondary | ICD-10-CM | POA: Diagnosis not present

## 2021-02-24 DIAGNOSIS — Z0000 Encounter for general adult medical examination without abnormal findings: Secondary | ICD-10-CM

## 2021-02-24 DIAGNOSIS — Z1211 Encounter for screening for malignant neoplasm of colon: Secondary | ICD-10-CM

## 2021-02-24 DIAGNOSIS — E781 Pure hyperglyceridemia: Secondary | ICD-10-CM | POA: Diagnosis not present

## 2021-02-24 DIAGNOSIS — G4733 Obstructive sleep apnea (adult) (pediatric): Secondary | ICD-10-CM

## 2021-02-24 DIAGNOSIS — R0602 Shortness of breath: Secondary | ICD-10-CM

## 2021-02-24 DIAGNOSIS — Z1159 Encounter for screening for other viral diseases: Secondary | ICD-10-CM

## 2021-02-24 MED ORDER — FLUTICASONE PROPIONATE 50 MCG/ACT NA SUSP: 2.0000 | Freq: Every day | NASAL | 3 refills | Status: AC

## 2021-02-24 NOTE — Patient Instructions (Addendum)
We are referring you to cardiology  Please go to the first floor and get chest x-ray  We are requesting "pulmonary function tests" done at the pulmonary office   GO TO THE LAB : Get the blood work     Timothy [name], Timothy [name] back for a checkup in 4 months

## 2021-02-24 NOTE — Progress Notes (Signed)
Subjective:    Patient ID: Timothy [name], male    DOB: December 11, 1965, 55 y.o.   MRN: [id number]  DOS:  02/24/2021 Type of visit - description: CPX  Here for CPX  In addition he reports decreased energy since he had COVID back in January 2021. He gets tired easily, is able to do all his activities of daily living but he gets DOE when walks up a hill or do something more strenuous than usual. Also, 3-4 times in the last few months has experienced a weird feeling of the chest, described as a tightness anteriorly and bilaterally. Sxs are random, at rest or when he is doing something. No associated with leaning forward, no associated with cough or wheezing.  Wt Readings from Last 3 Encounters:  02/24/21 267 lb 4 oz (121.2 kg)  09/24/20 261 lb (118.4 kg)  07/17/19 265 lb 8 oz (120.4 kg)     Review of Systems No GI symptoms, no LUTS  Other than above, a 14 point review of systems is negative      Past Medical History:  Diagnosis Date   Allergic rhinitis    Chest pain 04-09-2008   stress test normal and echo (NI), GB US negative had a previous eval by cards as well   Erectile dysfunction    GERD (gastroesophageal reflux disease)    Hypertriglyceridemia    Right groin pain 2016   Dr. Jeffie Pollock, plan is renal US, and cystoscopy   Shingles 03-2009   Spinal stenosis at L4-L5 level    moderate   Spondylolisthesis    slight, across L4-5   Undescended right testicle    s/p surgery as a child---us 11-11 "absent right testcile"    Past Surgical History:  Procedure Laterality Date   FRACTURE SURGERY     left leg   OTHER SURGICAL HISTORY     undescended testicle surgery, right   VASECTOMY  2009    Social History   Socioeconomic History   Marital status: Married    Spouse name: Not on file   Number of children: 1   Years of education: Not on file   Highest education level: Not on file  Occupational History   Occupation: controller  Tobacco Use   Smoking status: Never    Smokeless tobacco: Never  Vaping Use   Vaping Use: Never used  Substance and Sexual Activity   Alcohol use: Yes    Alcohol/week: 3.0 standard drinks    Types: 3 Cans of beer per week    Comment: socially    Drug use: No   Sexual activity: Not on file  Other Topics Concern   Not on file  Social History Narrative   Child 16   Divorced, re married 01-2016   Social Determinants of Health   Financial Resource Strain: Not on file  Food Insecurity: Not on file  Transportation Needs: Not on file  Physical Activity: Not on file  Stress: Not on file  Social Connections: Not on file  Intimate Partner Violence: Not on file    Allergies as of 02/24/2021       Reactions   Aspirin Rash   Decongest Rash   rash   Penicillins Rash        Medication List        Accurate as of February 24, 2021 11:59 PM. If you have any questions, ask your nurse or doctor.          esomeprazole 40 MG capsule Commonly known  as: NEXIUM TAKE 1 CAPSULE BY MOUTH EVERY DAY BEFORE BREAKFAST   fluticasone 50 MCG/ACT nasal spray Commonly known as: FLONASE Place 2 sprays into both nostrils daily.   ibuprofen 800 MG tablet Commonly known as: ADVIL Take 1 tablet (800 mg total) by mouth 2 (two) times daily as needed for moderate pain.   levocetirizine 5 MG tablet Commonly known as: XYZAL Take 1 tablet (5 mg total) by mouth every evening.   tadalafil 5 MG tablet Commonly known as: CIALIS Take 1 tablet (5 mg total) by mouth daily.           Objective:   Physical Exam BP 116/72 (BP Location: Left Arm, Patient Position: Sitting, Cuff Size: Normal)   Pulse 72   Temp 98.1 F (36.7 C) (Oral)   Resp 16   Ht 5\' 11"  (1.803 m)   Wt 267 lb 4 oz (121.2 kg)   SpO2 97%   BMI 37.27 kg/m  General: Well developed, NAD, BMI noted Neck: No  thyromegaly  HEENT:  Normocephalic . Face symmetric, atraumatic Lungs:  CTA B Normal respiratory effort, no intercostal retractions, no accessory muscle  use. Heart: RRR,  no murmur.  Abdomen:  Not distended, soft, non-tender. No rebound or rigidity.   Lower extremities: no pretibial edema bilaterally  Skin: Exposed areas without rash. Not pale. Not jaundice Neurologic:  alert & oriented X3.  Speech normal, gait appropriate for age and unassisted Strength symmetric and appropriate for age.  Psych: Cognition and judgment appear intact.  Cooperative with normal attention span and concentration.  Behavior appropriate. No anxious or depressed appearing.     Assessment      Assessment Dyslipidemia GERD Chest pain 2009, normal echo/ stress test/ GB US Shingles 2010 ED  GU: --Undescended testicle, R, s/p unsuccessful surgery. -- UTI recurrent (third UTI dx 12-2014), groin pain >>>saw urology 02-2015-- rx [place] --Increased PSA 12-2014 in the context of a UTI, saw urology 02-2015, apparently elevated PSA not adressed MSK: chronic  back pain, onset ~2015, s/p local injection x 1 ~ 08-2014 ([facility]) Aspirin allergy (able to take ibuprofen regularly) COVID-19 DX 08-2020.  PLAN: Here for CPX  Dyslipidemia: Diet controlled.  Checking labs Fatigue, DOE, chest pain: Symptoms started after COVID 08-2020. EKG today Epworth scale: 11, elevated  DDX: Possibly multifactorial including COVID infection, sleep apnea, obesity, underlying CAD less likely. Plan: Chest x-ray, PFTs, refer to cardiology. Further eval?. RTC 4 months     In addition to CPX, I addressed his other symptoms including fatigue, DOE, chest pain.  Refer to cardiology. This visit occurred during the SARS-CoV-2 public health emergency.  Safety protocols were in place, including screening questions prior to the visit, additional usage of staff PPE, and extensive cleaning of exam room while observing appropriate contact time as indicated for disinfecting solutions.

## 2021-02-25 ENCOUNTER — Encounter: Payer: Self-pay | Admitting: Internal Medicine

## 2021-02-25 LAB — CBC WITH DIFFERENTIAL/PLATELET
Basophils Absolute: 0.1 10*3/uL (ref 0.0–0.1)
Basophils Relative: 1.2 % (ref 0.0–3.0)
Eosinophils Absolute: 0.3 10*3/uL (ref 0.0–0.7)
Eosinophils Relative: 4.1 % (ref 0.0–5.0)
HCT: 45.6 % (ref 39.0–52.0)
Hemoglobin: 15.8 g/dL (ref 13.0–17.0)
Lymphocytes Relative: 36.2 % (ref 12.0–46.0)
Lymphs Abs: 2.4 10*3/uL (ref 0.7–4.0)
MCHC: 34.7 g/dL (ref 30.0–36.0)
MCV: 88.5 fl (ref 78.0–100.0)
Monocytes Absolute: 0.6 10*3/uL (ref 0.1–1.0)
Monocytes Relative: 9.3 % (ref 3.0–12.0)
Neutro Abs: 3.3 10*3/uL (ref 1.4–7.7)
Neutrophils Relative %: 49.2 % (ref 43.0–77.0)
Platelets: 212 10*3/uL (ref 150.0–400.0)
RBC: 5.15 Mil/uL (ref 4.22–5.81)
RDW: 13.3 % (ref 11.5–15.5)
WBC: 6.6 10*3/uL (ref 4.0–10.5)

## 2021-02-25 LAB — COMPREHENSIVE METABOLIC PANEL
ALT: 18 U/L (ref 0–53)
AST: 17 U/L (ref 0–37)
Albumin: 4.4 g/dL (ref 3.5–5.2)
Alkaline Phosphatase: 46 U/L (ref 39–117)
BUN: 17 mg/dL (ref 6–23)
CO2: 29 mEq/L (ref 19–32)
Calcium: 9.3 mg/dL (ref 8.4–10.5)
Chloride: 102 mEq/L (ref 96–112)
Creatinine, Ser: 1.1 mg/dL (ref 0.40–1.50)
GFR: 75.69 mL/min (ref >60.00)
Glucose, Bld: 96 mg/dL (ref 70–99)
Potassium: 4.3 mEq/L (ref 3.5–5.1)
Sodium: 137 mEq/L (ref 135–145)
Total Bilirubin: 0.8 mg/dL (ref 0.2–1.2)
Total Protein: 6.4 g/dL (ref 6.0–8.3)

## 2021-02-25 LAB — LDL CHOLESTEROL, DIRECT: Direct LDL: 90 mg/dL

## 2021-02-25 LAB — LIPID PANEL
Cholesterol: 153 mg/dL (ref 0–200)
HDL: 35.8 mg/dL — ABNORMAL LOW (ref >39.00)
NonHDL: 117.36
Total CHOL/HDL Ratio: 4
Triglycerides: 301 mg/dL — ABNORMAL HIGH (ref 0.0–149.0)
VLDL: 60.2 mg/dL — ABNORMAL HIGH (ref 0.0–40.0)

## 2021-02-25 LAB — TSH: TSH: 0.94 u[IU]/mL (ref 0.35–5.50)

## 2021-02-25 LAB — HEMOGLOBIN A1C: Hgb A1c MFr Bld: 5.2 % (ref 4.6–6.5)

## 2021-02-25 LAB — PSA: PSA: 1.83 ng/mL (ref 0.10–4.00)

## 2021-02-25 LAB — HEPATITIS C ANTIBODY
Hepatitis C Ab: NONREACTIVE
SIGNAL TO CUT-OFF: 0.01 (ref <1.00)

## 2021-02-25 NOTE — Assessment & Plan Note (Signed)
Here for CPX  Dyslipidemia: Diet controlled.  Checking labs Fatigue, DOE, chest pain: Symptoms started after COVID 08-2020. EKG today Epworth scale: 11, elevated  DDX: Possibly multifactorial including COVID infection, sleep apnea, obesity, underlying CAD less likely. Plan: Chest x-ray, PFTs, refer to cardiology. Further eval?. RTC 4 months

## 2021-02-25 NOTE — Assessment & Plan Note (Addendum)
-   Td 2017 -Has declined flu shot consistently -Had COVID-vaccine x2, reluctant to proceed with third vaccine, counseled. -Shingrex  discussed --CCS:  cscope 06/2017, next due, referral sent. -Prostate cancer screening:   check PSA --counseled about diet-exercise --labs : CMP, FLP, CBC, A1c, TSH, PSA, hep C

## 2021-03-05 ENCOUNTER — Other Ambulatory Visit: Payer: Self-pay | Admitting: Internal Medicine

## 2021-03-17 ENCOUNTER — Other Ambulatory Visit: Payer: Self-pay | Admitting: Internal Medicine

## 2021-05-07 ENCOUNTER — Other Ambulatory Visit: Payer: Self-pay

## 2021-05-07 ENCOUNTER — Encounter: Payer: Self-pay | Admitting: Cardiology

## 2021-05-07 ENCOUNTER — Ambulatory Visit: Payer: BC Managed Care – PPO | Admitting: Cardiology

## 2021-05-07 VITALS — BP 120/72 | HR 64 | Ht 71.0 in | Wt 266.6 lb

## 2021-05-07 DIAGNOSIS — E781 Pure hyperglyceridemia: Secondary | ICD-10-CM | POA: Diagnosis not present

## 2021-05-07 DIAGNOSIS — R072 Precordial pain: Secondary | ICD-10-CM | POA: Diagnosis not present

## 2021-05-07 DIAGNOSIS — R079 Chest pain, unspecified: Secondary | ICD-10-CM

## 2021-05-07 DIAGNOSIS — R0600 Dyspnea, unspecified: Secondary | ICD-10-CM

## 2021-05-07 DIAGNOSIS — R0609 Other forms of dyspnea: Secondary | ICD-10-CM

## 2021-05-07 MED ORDER — METOPROLOL TARTRATE 100 MG PO TABS: 100.0000 mg | ORAL_TABLET | Freq: Once | ORAL | 0 refills | Status: DC

## 2021-05-07 NOTE — Patient Instructions (Addendum)
Medication Instructions:  Your physician recommends that you continue on your current medications as directed. Please refer to the Current Medication list given to you today.  *If you need a refill on your cardiac medications before your next appointment, please call your pharmacy*   Lab Work: TODAY: BMET Fasting lipid panel on the same day as your echocardiogram  If you have labs (blood work) drawn today and your tests are completely normal, you will receive your results only by: [place] (if you have MyChart) OR A paper copy in the mail If you have any lab test that is abnormal or we need to change your treatment, we will call you to review the results.   Testing/Procedures: Your physician has requested that you have an echocardiogram. Echocardiography is a painless test that uses sound waves to create images of your heart. It provides your doctor with information about the size and shape of your heart and how well your heart's chambers and valves are working. This procedure takes approximately one hour. There are no restrictions for this procedure.  Your physician has requested that you have a coronary CTA scan done. Please see below for further instructions.    Follow-Up: At [facility], you and your health needs are our priority.  As part of our continuing mission to provide you with exceptional heart care, we have created designated Provider Care Teams.  These Care Teams include your primary Cardiologist (physician) and Advanced Practice Providers (APPs -  Physician Assistants and Nurse Practitioners) who all work together to provide you with the care you need, when you need it.  Follow up with Dr. Radford Pax as needed based on results of testing.    Other Instructions   Your cardiac CT will be scheduled at:   [facility] [address] [place], [place] [address] [phone]  Please arrive at the [facility] main entrance (entrance A) of [facility] 30 minutes prior to test start time. Proceed to the [facility] Radiology Department (first floor) to check-in and test prep Please follow these instructions carefully (unless otherwise directed):  Hold all erectile dysfunction medications at least 3 days (72 hrs) prior to test.  On the Night Before the Test: Be sure to Drink plenty of water. Do not consume any caffeinated/decaffeinated beverages or chocolate 12 hours prior to your test. Do not take any antihistamines 12 hours prior to your test.   On the Day of the Test: Drink plenty of water until 1 hour prior to the test. Do not eat any food 4 hours prior to the test. You may take your regular medications prior to the test.  Take metoprolol (Lopressor) two hours prior to test.      After the Test: Drink plenty of water. After receiving IV contrast, you may experience a mild flushed feeling. This is normal. On occasion, you may experience a mild rash up to 24 hours after the test. This is not dangerous. If this occurs, you can take Benadryl 25 mg and increase your fluid intake. If you experience trouble breathing, this can be serious. If it is severe call 911 IMMEDIATELY. If it is mild, please call our office. If you take any of these medications: Glipizide/Metformin, Avandament, Glucavance, please do not take 48 hours after completing test unless otherwise instructed.  Please allow 2-4 weeks for scheduling of routine cardiac CTs. Some insurance companies require a pre-authorization which may delay scheduling of this test.   For non-scheduling related questions, please  contact the cardiac imaging nurse navigator should you have any questions/concerns: Marchia Bond, Cardiac Imaging Nurse Navigator Gordy Clement, Cardiac Imaging Nurse Navigator [facility] Direct Office Dial: [phone]   For scheduling needs, including cancellations and rescheduling, please call [place], [phone].

## 2021-05-07 NOTE — Addendum Note (Signed)
Addended by: Antonieta Iba on: 05/07/2021 01:57 PM   Modules accepted: Orders

## 2021-05-07 NOTE — Progress Notes (Signed)
Cardiology CONSULT Note    Date:  05/07/2021   ID:  Timothy [name], DOB 1965/09/02, MRN [id number]  PCP:  Colon Branch, MD  Cardiologist:  Fransico Him, MD   Chief Complaint  Patient presents with   New Patient (Initial Visit)    Chest pain and SOB     History of Present Illness:  Timothy [name] is a 55 y.o. male who is being seen today for the evaluation of chest pain and SOB at the request of Colon Branch, MD.  This is a 55yo male with a hx of chest pain in the past with normal stress test and echo in 2009, GERD, HLD who is referred for evaluation of SOB and chest pain. He has noticed decreased energy and gets out of breath quicker than he used to which started after he had COVID 19 in January.  The SOB is mainly exertional.  He is also having chest pains that are sporadic and occur every 2-3 weeks. It is nonexertional and exertional and can occur just sitting at his desk at work or on the couch.  There is no diaphoresis but has felt nauseated with the pain in the past.  There is no radiation of the pain.  The CP has occurred from time to time for years and similar to what he had in 2009 when he had a normal stress test and echo.  He has never smoked and has no fm hx of CAD.    Past Medical History:  Diagnosis Date   Allergic rhinitis    Chest pain 04/09/2008   stress test normal and echo (NI), GB US negative had a previous eval by cards as well   COVID-19    Erectile dysfunction    GERD (gastroesophageal reflux disease)    Hypertriglyceridemia    Right groin pain 2016   Dr. 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   ibuprofen (ADVIL) 800 MG tablet TAKE 1 TABLET (800 MG TOTAL) BY MOUTH 2 (TWO) TIMES DAILY AS NEEDED FOR MODERATE PAIN.   levocetirizine (XYZAL) 5 MG tablet Take 1 tablet (5 mg total) by mouth every evening.   tadalafil (CIALIS) 5 MG tablet TAKE 1 TABLET (5 MG TOTAL) BY MOUTH DAILY.   traZODone (DESYREL) 50 MG tablet Take 50 mg by mouth at bedtime.    Allergies:   Aspirin, Decongest, and Penicillins   Social History   Socioeconomic History   Marital status: Married    Spouse name: Not on file   Number of children: 1   Years of education: Not on file   Highest education level: Not on file  Occupational History   Occupation: controller  Tobacco Use   Smoking status: Never   Smokeless tobacco: Never  Vaping Use  Vaping Use: Never used  Substance and Sexual Activity   Alcohol use: Yes    Alcohol/week: 3.0 standard drinks    Types: 3 Cans of beer per week    Comment: socially    Drug use: No   Sexual activity: Not on file  Other Topics Concern   Not on file  Social History Narrative   Child 22   Divorced, re married 01-2016   Social Determinants of Health   Financial Resource Strain: Not on file  Food Insecurity: Not on file  Transportation Needs: Not on file  Physical Activity: Not on file  Stress: Not on file  Social Connections: Not on file     Family History:  The patient's family history includes Allergies in his mother; Asthma in his mother; Breast cancer in his sister; Cancer in his brother; Diabetes in his father; Lung cancer in his mother.   ROS:   Please see the history of present illness.    ROS All other systems reviewed and are negative.  No flowsheet data found.     PHYSICAL EXAM:   VS:  BP 120/72   Pulse 64    Ht 5\' 11"  (1.803 m)   Wt 266 lb 9.6 oz (120.9 kg)   SpO2 97%   BMI 37.18 kg/m    GEN: Well nourished, well developed, in no acute distress  HEENT: normal  Neck: no JVD, carotid bruits, or masses Cardiac: RRR; no murmurs, rubs, or gallops,no edema.  Intact distal pulses bilaterally.  Respiratory:  clear to auscultation bilaterally, normal work of breathing GI: soft, nontender, nondistended, + BS MS: no deformity or atrophy  Skin: warm and dry, no rash Neuro:  Alert and Oriented x 3, Strength and sensation are intact Psych: euthymic mood, full affect  Wt Readings from Last 3 Encounters:  05/07/21 266 lb 9.6 oz (120.9 kg)  02/24/21 267 lb 4 oz (121.2 kg)  09/24/20 261 lb (118.4 kg)      Studies/Labs Reviewed:   EKG:  EKG is not ordered today.    Recent Labs: 02/24/2021: ALT 18; BUN 17; Creatinine, Ser 1.10; Hemoglobin 15.8; Platelets 212.0; Potassium 4.3; Sodium 137; TSH 0.94   Lipid Panel    Component Value Date/Time   CHOL 153 02/24/2021 1543   TRIG 301.0 (H) 02/24/2021 1543   TRIG 139 06/23/2006 1059   HDL 35.80 (L) 02/24/2021 1543   CHOLHDL 4 02/24/2021 1543   VLDL 60.2 (H) 02/24/2021 1543   LDLCALC 96 07/17/2019 1521   LDLDIRECT 90.0 02/24/2021 1543    Additional studies/ records that were reviewed today include:  OV notes    ASSESSMENT:    1. Chest pain of uncertain etiology   2. DOE (dyspnea on exertion)   3. HYPERTRIGLYCERIDEMIA      PLAN:  In order of problems listed above:  Chest pain  -pain is atypical for angina and has occurred sporadically for years with normal stress test and echo in 20009 -EKG recently done at PCP  showed inferior infarct -his CRFs include hyperlipidemia -I will get a coronary CTA to define coronary anatomy  2.  SOB -this started after having COVID 19 in Jan 2022 and has not had any other respiratory sx -will check 2D echo to assess LVF especially with Q wave on EKG -if coronary CTA and echo are normal then refer to  Pulmonary  3.  Hypertriglyceridemia -TAGS were in the 300s in July 2022 but not fasting -check FLP  Time Spent: 25 minutes total  time of encounter, including 15 minutes spent in face-to-face patient care on the date of this encounter. This time includes coordination of care and counseling regarding above mentioned problem list. Remainder of non-face-to-face time involved reviewing chart documents/testing relevant to the patient encounter and documentation in the medical record. I have independently reviewed documentation from referring provider  Medication Adjustments/Labs and Tests Ordered: Current medicines are reviewed at length with the patient today.  Concerns regarding medicines are outlined above.  Medication changes, Labs and Tests ordered today are listed in the Patient Instructions below.  There are no Patient Instructions on file for this visit.   Signed, Fransico Him, MD  05/07/2021 1:36 PM    [facility] [address], [place], [place]  [address] Phone: [phone]; Fax: [phone]

## 2021-05-07 NOTE — Addendum Note (Signed)
Addended by: Antonieta Iba on: 05/07/2021 01:46 PM   Modules accepted: Orders

## 2021-05-08 LAB — BASIC METABOLIC PANEL
BUN/Creatinine Ratio: 21 — ABNORMAL HIGH (ref 9–20)
BUN: 20 mg/dL (ref 6–24)
CO2: 22 mmol/L (ref 20–29)
Calcium: 9.2 mg/dL (ref 8.7–10.2)
Chloride: 105 mmol/L (ref 96–106)
Creatinine, Ser: 0.94 mg/dL (ref 0.76–1.27)
Glucose: 78 mg/dL (ref 65–99)
Potassium: 4.2 mmol/L (ref 3.5–5.2)
Sodium: 140 mmol/L (ref 134–144)
eGFR: 96 mL/min/{1.73_m2} (ref >59)

## 2021-05-28 ENCOUNTER — Other Ambulatory Visit (HOSPITAL_COMMUNITY): Payer: BC Managed Care – PPO

## 2021-05-28 ENCOUNTER — Other Ambulatory Visit: Payer: BC Managed Care – PPO

## 2021-05-28 ENCOUNTER — Ambulatory Visit (HOSPITAL_COMMUNITY): Payer: BC Managed Care – PPO

## 2021-06-05 ENCOUNTER — Telehealth (HOSPITAL_COMMUNITY): Payer: Self-pay | Admitting: Emergency Medicine

## 2021-06-05 NOTE — Telephone Encounter (Signed)
Reaching out to patient to offer assistance regarding upcoming cardiac imaging study; pt verbalizes understanding of appt date/time, parking situation and where to check in, pre-test NPO status and medications ordered, and verified current allergies; name and call back number provided for further questions should they arise Marchia Bond RN Navigator Cardiac Imaging [facility] [phone] office [phone] cell  100mg  metoprolol tart  Denies iv issues Denies claustro Holding cialis x 3 days

## 2021-06-09 ENCOUNTER — Ambulatory Visit (HOSPITAL_BASED_OUTPATIENT_CLINIC_OR_DEPARTMENT_OTHER): Payer: BC Managed Care – PPO

## 2021-06-09 ENCOUNTER — Other Ambulatory Visit: Payer: BC Managed Care – PPO | Admitting: *Deleted

## 2021-06-09 ENCOUNTER — Ambulatory Visit (HOSPITAL_COMMUNITY)
Admission: RE | Admit: 2021-06-09 | Discharge: 2021-06-09 | Disposition: A | Discharge status: Home / Self Care | Payer: BC Managed Care – PPO | Source: Ambulatory Visit | Attending: Cardiology | Admitting: Cardiology

## 2021-06-09 ENCOUNTER — Encounter (HOSPITAL_COMMUNITY): Payer: Self-pay

## 2021-06-09 ENCOUNTER — Encounter: Payer: Self-pay | Admitting: Cardiology

## 2021-06-09 ENCOUNTER — Other Ambulatory Visit: Payer: Self-pay

## 2021-06-09 DIAGNOSIS — R0609 Other forms of dyspnea: Secondary | ICD-10-CM

## 2021-06-09 DIAGNOSIS — R079 Chest pain, unspecified: Secondary | ICD-10-CM

## 2021-06-09 DIAGNOSIS — R072 Precordial pain: Secondary | ICD-10-CM

## 2021-06-09 DIAGNOSIS — E781 Pure hyperglyceridemia: Secondary | ICD-10-CM

## 2021-06-09 DIAGNOSIS — I2510 Atherosclerotic heart disease of native coronary artery without angina pectoris: Secondary | ICD-10-CM | POA: Insufficient documentation

## 2021-06-09 HISTORY — DX: Atherosclerotic heart disease of native coronary artery without angina pectoris: I25.10

## 2021-06-09 LAB — LIPID PANEL
Chol/HDL Ratio: 4.7 ratio (ref 0.0–5.0)
Cholesterol, Total: 170 mg/dL (ref 100–199)
HDL: 36 mg/dL — ABNORMAL LOW (ref >39)
LDL Chol Calc (NIH): 103 mg/dL — ABNORMAL HIGH (ref 0–99)
Triglycerides: 175 mg/dL — ABNORMAL HIGH (ref 0–149)
VLDL Cholesterol Cal: 31 mg/dL (ref 5–40)

## 2021-06-09 LAB — ECHOCARDIOGRAM COMPLETE
Area-P 1/2: 4.06 cm2
S' Lateral: 2.7 cm

## 2021-06-09 MED ORDER — IOHEXOL 350 MG/ML SOLN
95.0000 mL | Freq: Once | INTRAVENOUS | Status: AC | PRN
  Administered 2021-06-09: 95 mL via INTRAVENOUS

## 2021-06-09 MED ORDER — PERFLUTREN LIPID MICROSPHERE
1.0000 mL | INTRAVENOUS | Status: AC | PRN
  Administered 2021-06-09: 3 mL via INTRAVENOUS

## 2021-06-09 MED ORDER — NITROGLYCERIN 0.4 MG SL SUBL: 0.8000 mg | SUBLINGUAL_TABLET | Freq: Once | SUBLINGUAL | Status: AC

## 2021-06-09 MED ORDER — NITROGLYCERIN 0.4 MG SL SUBL
SUBLINGUAL_TABLET | SUBLINGUAL | Status: AC
  Administered 2021-06-09: 0.8 mg via SUBLINGUAL
  Filled 2021-06-09: qty 2

## 2021-06-11 ENCOUNTER — Other Ambulatory Visit: Payer: Self-pay

## 2021-06-11 ENCOUNTER — Telehealth: Payer: Self-pay

## 2021-06-11 DIAGNOSIS — E781 Pure hyperglyceridemia: Secondary | ICD-10-CM

## 2021-06-11 DIAGNOSIS — R0609 Other forms of dyspnea: Secondary | ICD-10-CM

## 2021-06-11 DIAGNOSIS — I2510 Atherosclerotic heart disease of native coronary artery without angina pectoris: Secondary | ICD-10-CM

## 2021-06-11 DIAGNOSIS — R079 Chest pain, unspecified: Secondary | ICD-10-CM

## 2021-06-11 MED ORDER — ROSUVASTATIN CALCIUM 10 MG PO TABS: 10.0000 mg | ORAL_TABLET | Freq: Every day | ORAL | 3 refills | Status: DC

## 2021-06-11 NOTE — Telephone Encounter (Signed)
Sueanne Margarita, MD  06/09/2021  9:44 PM EDT Back to Top    Echo showed normal heart function with moderately thickened heart muscle>please get cardiac MRI with gad to rule out HOCM   The patient has been notified of the result and verbalized understanding.  All questions (if any) were answered. Antonieta Iba, RN 06/11/2021 9:11 AM  Patient will start on Crestor 10 mg daily. Labs and MRI have been ordered.

## 2021-06-11 NOTE — Telephone Encounter (Signed)
-----   Message from Sueanne Margarita, MD sent at 06/09/2021  9:35 PM EDT ----- LDL goal < 70 and TAGS too high - start Crestor 10mg  daily and repeat FLP and ALT in 6 weeks

## 2021-06-30 ENCOUNTER — Other Ambulatory Visit: Payer: Self-pay

## 2021-06-30 ENCOUNTER — Encounter: Payer: Self-pay | Admitting: Internal Medicine

## 2021-06-30 ENCOUNTER — Ambulatory Visit: Payer: BC Managed Care – PPO | Admitting: Internal Medicine

## 2021-06-30 VITALS — BP 114/76 | HR 73 | Temp 98.5°F | Resp 16 | Ht 71.0 in | Wt 270.2 lb

## 2021-06-30 DIAGNOSIS — R0602 Shortness of breath: Secondary | ICD-10-CM

## 2021-06-30 DIAGNOSIS — E785 Hyperlipidemia, unspecified: Secondary | ICD-10-CM

## 2021-06-30 DIAGNOSIS — R0789 Other chest pain: Secondary | ICD-10-CM

## 2021-06-30 NOTE — Progress Notes (Signed)
Subjective:    Patient ID: Timothy [name], male    DOB: [birth year], 55 y.o.   MRN: [id number]  DOS:  06/30/2021 Type of visit - description: Follow-up  Follow-up from previous visit. Since then, saw cardiology, work-up done, chart reviewed. He continues to feel fatigue and having DOE.  Denies cough or wheezing. Symptoms have not changed.  Wt Readings from Last 3 Encounters:  06/30/21 270 lb 3.2 oz (122.6 kg)  05/07/21 266 lb 9.6 oz (120.9 kg)  02/24/21 267 lb 4 oz (121.2 kg)     Review of Systems See above   Past Medical History:  Diagnosis Date   Allergic rhinitis    CAD (coronary artery disease), native coronary artery    Coronary CTA showed minimal coronary Ca at 5 with minimal CAD with < 25% stenosis in the RCA and LAD 05/2021   Chest pain 04/09/2008   stress test normal and echo (NI), GB US negative had a previous eval by cards as well   COVID-19    Erectile dysfunction    GERD (gastroesophageal reflux disease)    Hypertriglyceridemia    Right groin pain 2016   Dr. 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 If you have any questions, ask your nurse or doctor.          esomeprazole 40 MG capsule Commonly known as: NEXIUM TAKE 1 CAPSULE BY MOUTH EVERY DAY BEFORE BREAKFAST   fluticasone 50 MCG/ACT nasal spray Commonly known as: FLONASE Place 2 sprays into both nostrils daily.   ibuprofen 800 MG  tablet Commonly known as: ADVIL TAKE 1 TABLET (800 MG TOTAL) BY MOUTH 2 (TWO) TIMES DAILY AS NEEDED FOR MODERATE PAIN.   levocetirizine 5 MG tablet Commonly known as: XYZAL Take 1 tablet (5 mg total) by mouth every evening.   metoprolol tartrate 100 MG tablet Commonly known as: LOPRESSOR Take 1 tablet (100 mg total) by mouth once for 1 dose. Take two hours prior to CT scan   rosuvastatin 10 MG tablet Commonly known as: CRESTOR Take 1 tablet (10 mg total) by mouth daily.   tadalafil 5 MG tablet Commonly known as: CIALIS TAKE 1 TABLET (5 MG TOTAL) BY MOUTH DAILY.   traZODone 50 MG tablet Commonly known as: DESYREL Take 50 mg by mouth at bedtime.           Objective:   Physical Exam BP 114/76   Pulse 73   Temp 98.5 F (36.9 C)   Resp 16   Ht 5\' 11"  (1.803 m)   Wt 270 lb 3.2 oz (122.6 kg)   SpO2 99%   BMI 37.69 kg/m  General:   Well developed, NAD, BMI noted. HEENT:  Normocephalic . Face symmetric, atraumatic  Lungs:  CTA B Normal respiratory effort, no intercostal retractions, no accessory muscle use. Heart: RRR,  no murmur.  Lower extremities: no pretibial edema bilaterally  Skin: Not pale. Not jaundice Neurologic:  alert & oriented X3.  Speech normal, gait appropriate for age and unassisted Psych--  Cognition and judgment appear intact.  Cooperative with normal attention span and concentration.  Behavior appropriate. No anxious or depressed appearing.      Assessment    Assessment Dyslipidemia GERD Chest pain 2009, normal echo/ stress test/ GB US Shingles 2010 ED  GU: --Undescended testicle, R, s/p unsuccessful surgery. -- UTI recurrent (third UTI dx 12-2014), groin pain >>>saw urology 02-2015-- rx [place] --Increased PSA 12-2014 in the context of a UTI, saw urology 02-2015, apparently elevated PSA not adressed MSK: chronic  back pain, onset ~2015, s/p local injection x 1 ~ 08-2014 ([facility]) Aspirin allergy (able to take ibuprofen regularly) COVID-19  DX 08-2020.  PLAN: Fatigue, DOE, chest pain: At the last visit he reported above symptoms. Saw cardiology 05/07/2021, work-up: -Coronary calcium score: 5, minimal CAD. -Echo: Question of HOCM, Rx cardiac MRI which is pending. At this point he feels about the same. My intention was to rx PFTs but I did not, at this point I do not think that is necessary. Plan: We will wait till cardiology work-up is done, if there is no explanation for his symptoms consider sleep study Dyslipidemia: Cardiology started Crestor based on last FLP.  Follow-up labs in few weeks. Preventive care: Declined flu and COVID vaccines. RTC 6 months     This visit occurred during the SARS-CoV-2 public health emergency.  Safety protocols were in place, including screening questions prior to the visit, additional usage of staff PPE, and extensive cleaning of exam room while observing appropriate contact time as indicated for disinfecting solutions.

## 2021-06-30 NOTE — Patient Instructions (Signed)
   GO TO THE FRONT DESK, PLEASE SCHEDULE YOUR APPOINTMENTS Come back for  a check up in 6 months  

## 2021-07-01 NOTE — Assessment & Plan Note (Signed)
Fatigue, DOE, chest pain: At the last visit he reported above symptoms. Saw cardiology 05/07/2021, work-up: -Coronary calcium score: 5, minimal CAD. -Echo: Question of HOCM, Rx cardiac MRI which is pending. At this point he feels about the same. My intention was to rx PFTs but I did not, at this point I do not think that is necessary. Plan: We will wait till cardiology work-up is done, if there is no explanation for his symptoms consider sleep study Dyslipidemia: Cardiology started Crestor based on last FLP.  Follow-up labs in few weeks. Preventive care: Declined flu and COVID vaccines. RTC 6 months

## 2021-07-02 DIAGNOSIS — M5416 Radiculopathy, lumbar region: Secondary | ICD-10-CM | POA: Diagnosis not present

## 2021-08-01 ENCOUNTER — Ambulatory Visit (HOSPITAL_COMMUNITY): Payer: BC Managed Care – PPO

## 2021-08-06 ENCOUNTER — Encounter: Payer: Self-pay | Admitting: Gastroenterology

## 2021-08-28 ENCOUNTER — Telehealth (HOSPITAL_COMMUNITY): Payer: Self-pay | Admitting: Emergency Medicine

## 2021-08-28 NOTE — Telephone Encounter (Signed)
Attempted to call patient regarding upcoming cardiac MR appointment. Left message on voicemail with name and callback number Karole Oo RN Navigator Cardiac Imaging [facility] [phone] Office [phone] Cell  

## 2021-08-29 ENCOUNTER — Other Ambulatory Visit: Payer: Self-pay

## 2021-08-29 ENCOUNTER — Other Ambulatory Visit: Payer: BC Managed Care – PPO | Admitting: *Deleted

## 2021-08-29 DIAGNOSIS — R079 Chest pain, unspecified: Secondary | ICD-10-CM | POA: Diagnosis not present

## 2021-08-29 DIAGNOSIS — I2510 Atherosclerotic heart disease of native coronary artery without angina pectoris: Secondary | ICD-10-CM | POA: Diagnosis not present

## 2021-08-29 DIAGNOSIS — E781 Pure hyperglyceridemia: Secondary | ICD-10-CM | POA: Diagnosis not present

## 2021-08-30 LAB — CBC
Hematocrit: 44.2 % (ref 37.5–51.0)
Hemoglobin: 15.5 g/dL (ref 13.0–17.7)
MCH: 30.5 pg (ref 26.6–33.0)
MCHC: 35.1 g/dL (ref 31.5–35.7)
MCV: 87 fL (ref 79–97)
Platelets: 244 10*3/uL (ref 150–450)
RBC: 5.09 x10E6/uL (ref 4.14–5.80)
RDW: 12.6 % (ref 11.6–15.4)
WBC: 7.5 10*3/uL (ref 3.4–10.8)

## 2021-08-30 LAB — LIPID PANEL
Chol/HDL Ratio: 2.6 ratio (ref 0.0–5.0)
Cholesterol, Total: 97 mg/dL — ABNORMAL LOW (ref 100–199)
HDL: 38 mg/dL — ABNORMAL LOW (ref >39)
LDL Chol Calc (NIH): 28 mg/dL (ref 0–99)
Triglycerides: 193 mg/dL — ABNORMAL HIGH (ref 0–149)
VLDL Cholesterol Cal: 31 mg/dL (ref 5–40)

## 2021-08-30 LAB — ALT: ALT: 20 IU/L (ref 0–44)

## 2021-09-01 ENCOUNTER — Other Ambulatory Visit: Payer: Self-pay

## 2021-09-01 ENCOUNTER — Ambulatory Visit (HOSPITAL_COMMUNITY)
Admission: RE | Admit: 2021-09-01 | Discharge: 2021-09-01 | Disposition: A | Discharge status: Home / Self Care | Payer: BC Managed Care – PPO | Source: Ambulatory Visit | Attending: Cardiology | Admitting: Cardiology

## 2021-09-01 DIAGNOSIS — E781 Pure hyperglyceridemia: Secondary | ICD-10-CM | POA: Diagnosis not present

## 2021-09-01 DIAGNOSIS — R079 Chest pain, unspecified: Secondary | ICD-10-CM | POA: Insufficient documentation

## 2021-09-01 DIAGNOSIS — R0609 Other forms of dyspnea: Secondary | ICD-10-CM | POA: Diagnosis not present

## 2021-09-01 DIAGNOSIS — I2510 Atherosclerotic heart disease of native coronary artery without angina pectoris: Secondary | ICD-10-CM | POA: Insufficient documentation

## 2021-09-01 MED ORDER — GADOBUTROL 1 MMOL/ML IV SOLN
10.0000 mL | Freq: Once | INTRAVENOUS | Status: AC | PRN
  Administered 2021-09-01: 10 mL via INTRAVENOUS

## 2021-09-02 ENCOUNTER — Telehealth: Payer: Self-pay

## 2021-09-02 ENCOUNTER — Other Ambulatory Visit: Payer: Self-pay

## 2021-09-02 DIAGNOSIS — I2510 Atherosclerotic heart disease of native coronary artery without angina pectoris: Secondary | ICD-10-CM

## 2021-09-02 DIAGNOSIS — E781 Pure hyperglyceridemia: Secondary | ICD-10-CM

## 2021-09-02 DIAGNOSIS — R0609 Other forms of dyspnea: Secondary | ICD-10-CM

## 2021-09-02 NOTE — Telephone Encounter (Signed)
Patient would like to go to [facility] for repeat labs. Orders have been placed.

## 2021-09-02 NOTE — Telephone Encounter (Signed)
-----   Message from Sueanne Margarita, MD sent at 09/01/2021  5:48 PM EST ----- Cardiac MRI showed normal LVF with EF 54%, no evidence of scar or HOCM, mildly enlarged RV and low normal RVF at 47%.  There is a hypermobile interatrial septum.  Please get a limited echo with saline contrast to rule out PFO.  Also find out if any family history of cardiomypathy or thickened heart muscle/sudden cardiac death

## 2021-09-02 NOTE — Telephone Encounter (Signed)
The patient has been notified of the result and verbalized understanding.  All questions (if any) were answered. Antonieta Iba, RN 09/02/2021 5:13 PM  No family hx of cardiomyopathy or sudden cardiac death.

## 2021-09-02 NOTE — Telephone Encounter (Signed)
-----   Message from Sueanne Margarita, MD sent at 09/01/2021  5:40 PM EST ----- Please have him come in for FLP fasting ----- Message ----- From: Antonieta Iba, RN Sent: 09/01/2021   5:29 PM EST To: Sueanne Margarita, MD  The patient was not fasting for the lab work

## 2021-09-07 ENCOUNTER — Other Ambulatory Visit: Payer: Self-pay | Admitting: Internal Medicine

## 2021-09-10 ENCOUNTER — Other Ambulatory Visit: Payer: Self-pay

## 2021-09-10 ENCOUNTER — Ambulatory Visit (AMBULATORY_SURGERY_CENTER): Payer: BC Managed Care – PPO

## 2021-09-10 VITALS — Ht 71.0 in | Wt 250.0 lb

## 2021-09-10 DIAGNOSIS — Z86010 Personal history of colonic polyps: Secondary | ICD-10-CM

## 2021-09-10 MED ORDER — NA SULFATE-K SULFATE-MG SULF 17.5-3.13-1.6 GM/177ML PO SOLN: 1.0000 | Freq: Once | ORAL | 0 refills | Status: AC

## 2021-09-10 NOTE — Progress Notes (Signed)
Denies allergies to eggs or soy products. Denies complication of anesthesia or sedation. Denies use of weight loss medication. Denies use of O2.   Emmi instructions given for colonoscopy.  

## 2021-09-19 ENCOUNTER — Encounter: Payer: Self-pay | Admitting: Gastroenterology

## 2021-09-20 ENCOUNTER — Other Ambulatory Visit: Payer: Self-pay | Admitting: Internal Medicine

## 2021-09-24 ENCOUNTER — Encounter: Payer: BC Managed Care – PPO | Admitting: Gastroenterology

## 2021-09-24 ENCOUNTER — Other Ambulatory Visit (HOSPITAL_COMMUNITY): Payer: BC Managed Care – PPO

## 2021-09-24 ENCOUNTER — Ambulatory Visit (AMBULATORY_SURGERY_CENTER): Payer: BC Managed Care – PPO | Admitting: Gastroenterology

## 2021-09-24 ENCOUNTER — Encounter: Payer: Self-pay | Admitting: Gastroenterology

## 2021-09-24 VITALS — BP 115/73 | HR 60 | Temp 97.7°F | Resp 20 | Ht 71.0 in | Wt 256.0 lb

## 2021-09-24 DIAGNOSIS — K5140 Inflammatory polyps of colon without complications: Secondary | ICD-10-CM | POA: Diagnosis not present

## 2021-09-24 DIAGNOSIS — Z86010 Personal history of colonic polyps: Secondary | ICD-10-CM

## 2021-09-24 DIAGNOSIS — D124 Benign neoplasm of descending colon: Secondary | ICD-10-CM

## 2021-09-24 DIAGNOSIS — D122 Benign neoplasm of ascending colon: Secondary | ICD-10-CM | POA: Diagnosis not present

## 2021-09-24 DIAGNOSIS — D123 Benign neoplasm of transverse colon: Secondary | ICD-10-CM | POA: Diagnosis not present

## 2021-09-24 DIAGNOSIS — Z1211 Encounter for screening for malignant neoplasm of colon: Secondary | ICD-10-CM | POA: Diagnosis not present

## 2021-09-24 DIAGNOSIS — D120 Benign neoplasm of cecum: Secondary | ICD-10-CM | POA: Diagnosis not present

## 2021-09-24 MED ORDER — SODIUM CHLORIDE 0.9 % IV SOLN: 500.0000 mL | Freq: Once | INTRAVENOUS | Status: DC

## 2021-09-24 NOTE — Patient Instructions (Signed)
Please read handouts provided. Continue present medications. Await pathology results.   YOU HAD AN ENDOSCOPIC PROCEDURE TODAY AT THE [facility]:   Refer to the procedure report that was given to you for any specific questions about what was found during the examination.  If the procedure report does not answer your questions, please call your gastroenterologist to clarify.  If you requested that your care partner not be given the details of your procedure findings, then the procedure report has been included in a sealed envelope for you to review at your convenience later.  YOU SHOULD EXPECT: Some feelings of bloating in the abdomen. Passage of more gas than usual.  Walking can help get rid of the air that was put into your GI tract during the procedure and reduce the bloating. If you had a lower endoscopy (such as a colonoscopy or flexible sigmoidoscopy) you may notice spotting of blood in your stool or on the toilet paper. If you underwent a bowel prep for your procedure, you may not have a normal bowel movement for a few days.  Please Note:  You might notice some irritation and congestion in your nose or some drainage.  This is from the oxygen used during your procedure.  There is no need for concern and it should clear up in a day or so.  SYMPTOMS TO REPORT IMMEDIATELY:  Following lower endoscopy (colonoscopy or flexible sigmoidoscopy):  Excessive amounts of blood in the stool  Significant tenderness or worsening of abdominal pains  Swelling of the abdomen that is new, acute  Fever of 100F or higher   For urgent or emergent issues, a gastroenterologist can be reached at any hour by calling [phone]. Do not use MyChart messaging for urgent concerns.    DIET:  We do recommend a small meal at first, but then you may proceed to your regular diet.  Drink plenty of fluids but you should avoid alcoholic beverages for 24 hours.  ACTIVITY:  You should plan to take it easy  for the rest of today and you should NOT DRIVE or use heavy machinery until tomorrow (because of the sedation medicines used during the test).    FOLLOW UP: Our staff will call the number listed on your records 48-72 hours following your procedure to check on you and address any questions or concerns that you may have regarding the information given to you following your procedure. If we do not reach you, we will leave a message.  We will attempt to reach you two times.  During this call, we will ask if you have developed any symptoms of COVID 19. If you develop any symptoms (ie: fever, flu-like symptoms, shortness of breath, cough etc.) before then, please call [phone].  If you test positive for Covid 19 in the 2 weeks post procedure, please call and report this information to us.    If any biopsies were taken you will be contacted by phone or by letter within the next 1-3 weeks.  Please call us at [phone] if you have not heard about the biopsies in 3 weeks.    SIGNATURES/CONFIDENTIALITY: You and/or your care partner have signed paperwork which will be entered into your electronic medical record.  These signatures attest to the fact that that the information above on your After Visit Summary has been reviewed and is understood.  Full responsibility of the confidentiality of this discharge information lies with you and/or your care-partner.  

## 2021-09-24 NOTE — Progress Notes (Signed)
Vitals-CW  Pt's states no medical or surgical changes since previsit or office visit. 

## 2021-09-24 NOTE — Progress Notes (Signed)
[facility] History and Physical   Primary Care Physician:  Colon Branch, MD   Reason for Procedure:  History of adenomatous colon polyps  Plan:    Surveillance colonoscopy with possible interventions as needed     HPI: Timothy [name] is a very pleasant 56 y.o. male here for surveillance colonoscopy. Denies any nausea, vomiting, abdominal pain, melena or bright red blood per rectum  The risks and benefits as well as alternatives of endoscopic procedure(s) have been discussed and reviewed. All questions answered. The patient agrees to proceed.    Past Medical History:  Diagnosis Date   Allergic rhinitis    Allergy    CAD (coronary artery disease), native coronary artery    Coronary CTA showed minimal coronary Ca at 5 with minimal CAD with < 25% stenosis in the RCA and LAD 05/2021   Chest pain 04/09/2008   stress test normal and echo (NI), GB US negative had a previous eval by cards as well   COVID-19    Erectile dysfunction    GERD (gastroesophageal reflux disease)    Hypertriglyceridemia    Right groin pain 2016   Dr. Jeffie Pollock, plan is renal US, and cystoscopy   Shingles 03/2009   Spinal stenosis at L4-L5 level    moderate   Spondylolisthesis    slight, across L4-5   Undescended right testicle    s/p surgery as a child---us 11-11 "absent right testcile"    Past Surgical History:  Procedure Laterality Date   FRACTURE SURGERY     left leg   OTHER SURGICAL HISTORY     undescended testicle surgery, right   VASECTOMY  2009    Prior to Admission medications   Medication Sig Start Date End Date Taking? Authorizing Provider  esomeprazole (NEXIUM) 40 MG capsule TAKE 1 CAPSULE BY MOUTH EVERY DAY BEFORE BREAKFAST 09/08/21  Yes Paz, Jacqulyn Bath E, MD  ibuprofen (ADVIL) 800 MG tablet TAKE 1 TABLET (800 MG TOTAL) BY MOUTH 2 (TWO) TIMES DAILY AS NEEDED FOR MODERATE PAIN. 09/22/21  Yes Colon Branch, MD  levocetirizine (XYZAL) 5 MG tablet Take 1 tablet (5 mg total) by mouth every  evening. 11/25/20  Yes Paz, Alda Berthold, MD  rosuvastatin (CRESTOR) 10 MG tablet Take 1 tablet (10 mg total) by mouth daily. 06/11/21  Yes Turner, Traci R, MD  tadalafil (CIALIS) 5 MG tablet TAKE 1 TABLET (5 MG TOTAL) BY MOUTH DAILY. 09/22/21   Colon Branch, MD  fluticasone [facility]) 50 MCG/ACT nasal spray Place 2 sprays into both nostrils daily. 02/24/21   Colon Branch, MD  traZODone (DESYREL) 50 MG tablet Take 50 mg by mouth at bedtime. 03/04/21   [provider]    Current Outpatient Medications  Medication Sig Dispense Refill   esomeprazole (NEXIUM) 40 MG capsule TAKE 1 CAPSULE BY MOUTH EVERY DAY BEFORE BREAKFAST 90 capsule 1   ibuprofen (ADVIL) 800 MG tablet TAKE 1 TABLET (800 MG TOTAL) BY MOUTH 2 (TWO) TIMES DAILY AS NEEDED FOR MODERATE PAIN. 60 tablet 5   levocetirizine (XYZAL) 5 MG tablet Take 1 tablet (5 mg total) by mouth every evening. 90 tablet 3   rosuvastatin (CRESTOR) 10 MG tablet Take 1 tablet (10 mg total) by mouth daily. 90 tablet 3   tadalafil (CIALIS) 5 MG tablet TAKE 1 TABLET (5 MG TOTAL) BY MOUTH DAILY. 30 tablet 5   fluticasone (FLONASE) 50 MCG/ACT nasal spray Place 2 sprays into both nostrils daily. 45 g 3   traZODone (DESYREL)  50 MG tablet Take 50 mg by mouth at bedtime.     Current Facility-Administered Medications  Medication Dose Route Frequency Provider Last Rate Last Admin   0.9 %  sodium chloride infusion  500 mL Intravenous Once Mauri Pole, MD        Allergies as of 09/24/2021 - Review Complete 09/24/2021  Allergen Reaction Noted   Aspirin Rash 11/17/2006   Decongest Rash 12/16/2010   Penicillins Rash 11/17/2006    Family History  Problem Relation Age of Onset   Asthma Mother    Allergies Mother    Lung cancer Mother    Diabetes Father    CAD Father    Breast cancer Sister    Cancer Brother        bone    Colon cancer Neg Hx    Prostate cancer Neg Hx    Esophageal cancer Neg Hx    Rectal cancer Neg Hx    Stomach cancer Neg Hx      Social History   Socioeconomic History   Marital status: Married    Spouse name: Not on file   Number of children: 1   Years of education: Not on file   Highest education level: Not on file  Occupational History   Occupation: controller  Tobacco Use   Smoking status: Never   Smokeless tobacco: Current    Types: Chew  Vaping Use   Vaping Use: Never used  Substance and Sexual Activity   Alcohol use: Yes    Alcohol/week: 3.0 standard drinks    Types: 3 Cans of beer per week    Comment: socially    Drug use: No   Sexual activity: Not on file  Other Topics Concern   Not on file  Social History Narrative   Child 19   Divorced, re married 01-2016   Social Determinants of Health   Financial Resource Strain: Not on file  Food Insecurity: Not on file  Transportation Needs: Not on file  Physical Activity: Not on file  Stress: Not on file  Social Connections: Not on file  Intimate Partner Violence: Not on file    Review of Systems:  All other review of systems negative except as mentioned in the HPI.  Physical Exam: Vital signs in last 24 hours: BP 126/60 (BP Location: Right Arm, Patient Position: Sitting, Cuff Size: Normal)    Pulse 70    Temp 97.7 F (36.5 C) (Temporal)    Ht 5\' 11"  (1.803 m)    Wt 256 lb (116.1 kg)    SpO2 98%    BMI 35.70 kg/m  General:   Alert, NAD Lungs:  Clear .   Heart:  Regular rate and rhythm Abdomen:  Soft, nontender and nondistended. Neuro/Psych:  Alert and cooperative. Normal mood and affect. A and O x 3  Reviewed labs, radiology imaging, old records and pertinent past GI work up  Patient is appropriate for planned procedure(s) and anesthesia in an ambulatory setting   K. Denzil Magnuson , MD [phone]

## 2021-09-24 NOTE — Op Note (Signed)
[facility] Patient Name: Amrit Cress Procedure Date: 09/24/2021 8:48 AM MRN: [id number] Endoscopist: Mauri Pole , MD Age: 56 Referring MD:  Date of Birth: [birth year] Gender: Male Account #: [id number] Procedure:                Colonoscopy Indications:              High risk colon cancer surveillance: Personal                            history of colonic polyps, High risk colon cancer                            surveillance: Personal history of sessile serrated                            colon polyp (less than 10 mm in size) with no                            dysplasia Medicines:                Monitored Anesthesia Care Procedure:                Pre-Anesthesia Assessment:                           - Prior to the procedure, a History and Physical                            was performed, and patient medications and                            allergies were reviewed. The patient's tolerance of                            previous anesthesia was also reviewed. The risks                            and benefits of the procedure and the sedation                            options and risks were discussed with the patient.                            All questions were answered, and informed consent                            was obtained. Prior Anticoagulants: The patient has                            taken no previous anticoagulant or antiplatelet                            agents. ASA Grade Assessment: II - A patient with  mild systemic disease. After reviewing the risks                            and benefits, the patient was deemed in                            satisfactory condition to undergo the procedure.                           After obtaining informed consent, the colonoscope                            was passed under direct vision. Throughout the                            procedure, the patient's blood pressure, pulse, and                             oxygen saturations were monitored continuously. The                            PCF-HQ190L Colonoscope was introduced through the                            anus and advanced to the the cecum, identified by                            appendiceal orifice and ileocecal valve. The                            colonoscopy was performed without difficulty. The                            patient tolerated the procedure well. The quality                            of the bowel preparation was excellent. The                            ileocecal valve, appendiceal orifice, and rectum                            were photographed. Scope In: 8:53:28 AM Scope Out: 9:21:56 AM Scope Withdrawal Time: 0 hours 19 minutes 3 seconds  Total Procedure Duration: 0 hours 28 minutes 28 seconds  Findings:                 The perianal and digital rectal examinations were                            normal.                           A less than 1 mm polyp was found in the cecum. The  polyp was sessile. The polyp was removed with a                            cold biopsy forceps. Resection and retrieval were                            complete.                           Three sessile polyps were found in the transverse                            colon and ascending colon. The polyps were 4 to 6                            mm in size. These polyps were removed with a cold                            snare. Resection and retrieval were complete.                           A 12 mm polyp was found in the descending colon.                            The polyp was sessile. The polyp was removed with a                            cold snare. Resection and retrieval were complete.                           Non-bleeding internal hemorrhoids were found during                            retroflexion. The hemorrhoids were medium-sized. Complications:            No immediate  complications. Estimated Blood Loss:     Estimated blood loss was minimal. Impression:               - One less than 1 mm polyp in the cecum, removed                            with a cold biopsy forceps. Resected and retrieved.                           - Three 4 to 6 mm polyps in the transverse colon                            and in the ascending colon, removed with a cold                            snare. Resected and retrieved.                           -  One 12 mm polyp in the descending colon, removed                            with a cold snare. Resected and retrieved.                           - Non-bleeding internal hemorrhoids. Recommendation:           - Patient has a contact number available for                            emergencies. The signs and symptoms of potential                            delayed complications were discussed with the                            patient. Return to normal activities tomorrow.                            Written discharge instructions were provided to the                            patient.                           - Resume previous diet.                           - Continue present medications.                           - Await pathology results.                           - Repeat colonoscopy in 3 - 5 years for                            surveillance based on pathology results. Mauri Pole, MD 09/24/2021 9:38:51 AM This report has been signed electronically.

## 2021-09-24 NOTE — Progress Notes (Signed)
A and O x3. Report to RN. Tolerated MAC anesthesia well.

## 2021-09-24 NOTE — Progress Notes (Signed)
Called to room to assist during endoscopic procedure.  Patient ID and intended procedure confirmed with present staff. Received instructions for my participation in the procedure from the performing physician.  

## 2021-09-26 ENCOUNTER — Telehealth: Payer: Self-pay

## 2021-09-26 NOTE — Telephone Encounter (Signed)
Called [phone] and left a message we tried to reach pt for a follow up call. maw

## 2021-09-26 NOTE — Telephone Encounter (Signed)
Left message on follow up call. 

## 2021-10-02 ENCOUNTER — Encounter: Payer: Self-pay | Admitting: Gastroenterology

## 2021-10-08 ENCOUNTER — Ambulatory Visit (HOSPITAL_COMMUNITY): Payer: BC Managed Care – PPO | Attending: Cardiology

## 2021-10-08 ENCOUNTER — Telehealth: Payer: Self-pay

## 2021-10-08 ENCOUNTER — Other Ambulatory Visit: Payer: Self-pay

## 2021-10-08 DIAGNOSIS — R0609 Other forms of dyspnea: Secondary | ICD-10-CM | POA: Insufficient documentation

## 2021-10-08 LAB — ECHOCARDIOGRAM LIMITED BUBBLE STUDY
Area-P 1/2: 2.88 cm2
S' Lateral: 2.8 cm

## 2021-10-08 NOTE — Telephone Encounter (Signed)
Patient reports that he is still having shortness of breath mostly with exertion. He would like a referral to pulmonology. Referral has been placed.

## 2021-10-08 NOTE — Telephone Encounter (Signed)
-----   Message from Sueanne Margarita, MD sent at 10/08/2021  1:10 PM EST ----- Please find out if this patient is still having SOB and if yes then refer to pulmonary

## 2021-10-22 ENCOUNTER — Other Ambulatory Visit: Payer: Self-pay

## 2021-10-22 ENCOUNTER — Ambulatory Visit: Payer: BC Managed Care – PPO | Admitting: Internal Medicine

## 2021-10-22 ENCOUNTER — Encounter: Payer: Self-pay | Admitting: Internal Medicine

## 2021-10-22 VITALS — BP 122/86 | HR 61 | Temp 97.7°F | Ht 70.5 in | Wt 265.8 lb

## 2021-10-22 DIAGNOSIS — R0602 Shortness of breath: Secondary | ICD-10-CM

## 2021-10-22 DIAGNOSIS — G4733 Obstructive sleep apnea (adult) (pediatric): Secondary | ICD-10-CM

## 2021-10-22 NOTE — Progress Notes (Addendum)
Timothy [name]    [id number]    1966/05/18  Primary Care Physician:Paz, Alda Berthold, MD  Referring Physician: Sueanne Margarita, MD [phone] [address] [place],  [place] [address] Reason for Consultation: shortness of breath Date of Consultation: 10/22/2021  Chief complaint:   Chief Complaint  Patient presents with   Consult    [place] for about a yr     HPI: Timothy [name] is a 56 y.o.man who presents for new patient evaluation for shortness of breath. He was referred by Dr. Radford Pax in cardiology after cardiac work up for his dyspnea was unremarkable   Shortness of breath progressing over the past year. Dyspnea usually with exertion. Now has fatigue that makes him take a nap. Symptoms started after he had covid about a year ago. Denies coughing, wheezing. Has not tried any breathing treatments or inhalers.  No childhood respiratory disease.   He does have seasonal allergic rhinitis.   Can do all his ADLs but feels fatigued earlier. He is having trouble falling asleep but once he falls asleep he stays asleep.   Was tested for sleep apnea about ten years ago.  He does snore. And snoring wakes him up.  Over the last ten years his weight has gone up and down and he feels about close to the weight he was at when he did his sleep study.   He does nap during the day   [place]  1.  Snoring?:  Yes 2.  Tired?:  Yes 3.  Observed apnea, stop breathing or choking/gasping during sleep?:  Yes 4.  Pressure. HTN history?  no 5.  BMI more than 35 kg/m2?  yes 6.  Age more than 63 yrs?  yes 7.  Neck size larger than 17 in for male or 16 in for male?  yes 8.  Gender = Male?  yes  Total:  6  For general population  OSA - Low Risk : Yes to 0 - 2 questions OSA - Intermediate Risk : Yes to 3 - 4 questions OSA - High Risk : Yes to 5 - 8 questions  or Yes to 2 or more of 4 STOP questions + male gender or Yes to 2 or more of 4 STOP questions + BMI > 35kg/m2  or  Yes to 2 or more of 4 STOP questions + neck circumference 17 inches / 43cm in male or 16 inches / 41cm in male  References: Rinaldo Cloud al. Anesthesiology 2008; 108: 812-821,  Gabriel Cirri et al Br Dara Hoyer 2012; 108: 355-974,  Gabriel Cirri et al J Clin Sleep Med Sept 2014.   Social history:  Occupation: works as a Network engineer, desk job Exposures: lives at home with wife.  Smoking history: never smoker. Passive smoke exposure in childhood.   Social History   Occupational History   Occupation: controller  Tobacco Use   Smoking status: Never   Smokeless tobacco: Former    Types: Nurse, children's Use: Never used  Substance and Sexual Activity   Alcohol use: Yes    Alcohol/week: 3.0 standard drinks    Types: 3 Cans of beer per week    Comment: socially    Drug use: No   Sexual activity: Not on file    Relevant family history:  Family History  Problem Relation Age of Onset   Asthma Mother    Allergies Mother  Lung cancer Mother    Diabetes Father    CAD Father    Breast cancer Sister    Cancer Brother        bone    Colon cancer Neg Hx    Prostate cancer Neg Hx    Esophageal cancer Neg Hx    Rectal cancer Neg Hx    Stomach cancer Neg Hx     Past Medical History:  Diagnosis Date   Allergic rhinitis    Allergy    CAD (coronary artery disease), native coronary artery    Coronary CTA showed minimal coronary Ca at 5 with minimal CAD with < 25% stenosis in the RCA and LAD 05/2021   Chest pain 04/09/2008   stress test normal and echo (NI), GB US negative had a previous eval by cards as well   COVID-19    Erectile dysfunction    GERD (gastroesophageal reflux disease)    Hypertriglyceridemia    Right groin pain 2016   Dr. Jeffie Pollock, plan is renal US, and cystoscopy   Shingles 03/2009   Spinal stenosis at L4-L5 level    moderate   Spondylolisthesis    slight, across L4-5   Undescended right testicle    s/p surgery as a child---us 11-11  "absent right testcile"    Past Surgical History:  Procedure Laterality Date   FRACTURE SURGERY     left leg   OTHER SURGICAL HISTORY     undescended testicle surgery, right   VASECTOMY  2009     Physical Exam: Blood pressure 122/86, pulse 61, temperature 97.7 F (36.5 C), temperature source Oral, height 5' 10.5" (1.791 m), weight 265 lb 12.8 oz (120.6 kg), SpO2 97 %. Gen:      No acute distress ENT:  mallampati III, no nasal polyps, mucus membranes moist Lungs:    No increased respiratory effort, symmetric chest wall excursion, clear to auscultation bilaterally, no wheezes or crackles CV:         Regular rate and rhythm; no murmurs, rubs, or gallops.  No pedal edema Abd:      + bowel sounds; soft, non-tender; no distension MSK: no acute synovitis of DIP or PIP joints, no mechanics hands.  Skin:      Warm and dry; no rashes Neuro: normal speech, no focal facial asymmetry Psych: alert and oriented x3, normal mood and affect   Data Reviewed/Medical Decision Making:  Independent interpretation of tests: Imaging:  Review of patient's CT Coronary calcium scan - lung window images revealed no acute pulmonary process. The patient's images have been independently reviewed by me.    Echocardiogram Normal LVEF and mild LHV. Negative for intracardiac shunt.   PFTs:  No flowsheet data found.  Labs:  Lab Results  Component Value Date   WBC 7.5 08/29/2021   HGB 15.5 08/29/2021   HCT 44.2 08/29/2021   MCV 87 08/29/2021   PLT 244 08/29/2021   Lab Results  Component Value Date   NA 140 05/07/2021   K 4.2 05/07/2021   CL 105 05/07/2021   CO2 22 05/07/2021      Immunization status:  Immunization History  Administered Date(s) Administered   PFIZER(Purple Top)SARS-COV-2 Vaccination 10/24/2019, 11/14/2019   Td 12/10/2003   Tdap 12/27/2015     I reviewed prior external note(s) from cardiology  I reviewed the result(s) of the labs and imaging as noted above.   I have  ordered pft, hsat.    Assessment:  Shortness of breath Fatigue  Plan/Recommendations:  Mr. Wiltsey has symptoms of dyspnea and fatigue following covid infection last year. He does not have symptoms of chest tightness or wheezing suggesting airway reactivity, so trial of albuterol seems low yield. He seems to have a harder time taking a breath in. Will proceed with full set of PFTs. No signs of ILD on his CT Chest.   His fatigue is a separate and perhaps confounding symptom. Had negative HSAT ten years ago. Will repeat. If negative I have a high enough suspicion to proceed with in lab sleep test.   I will see him back after these tests.   We discussed disease management and progression at length today.   ADDENDUM: Patient's sleep study shows mild sleep apnea with low oxygen levels at night. Because he is very fatigued and symptomatic I think this warrants a trial of therapy. He has symptoms of excessive daytime sleepiness and daytime somnolence.    Please prescribe auto titrating CPAP with a pressure setting of 5-15, medium face mask with tubing and supplies.    Needs appointment 31-90 days from initiation of therapy to follow up.   Return to Care: Return in about 2 months (around 12/22/2021).  Lenice Llamas, MD Pulmonary and [facility]  CC: Sueanne Margarita, MD

## 2021-10-22 NOTE — Patient Instructions (Signed)
Please schedule follow up scheduled with myself in 2 months.  If my schedule is not open yet, we will contact you with a reminder closer to that time. Please call [phone] if you haven't heard from [place] a month before.  ? ?Before your next visit I would like you to have: ?Full set of PFTs - next available ?Home sleep study - there may be a wait on this. We will call you  ? ?

## 2021-12-10 ENCOUNTER — Other Ambulatory Visit: Payer: Self-pay | Admitting: Internal Medicine

## 2021-12-26 ENCOUNTER — Ambulatory Visit: Payer: BC Managed Care – PPO | Admitting: Internal Medicine

## 2021-12-26 ENCOUNTER — Ambulatory Visit (INDEPENDENT_AMBULATORY_CARE_PROVIDER_SITE_OTHER): Payer: BC Managed Care – PPO | Admitting: Internal Medicine

## 2021-12-26 ENCOUNTER — Encounter: Payer: Self-pay | Admitting: Internal Medicine

## 2021-12-26 VITALS — BP 108/70 | HR 71 | Temp 98.0°F | Ht 71.0 in | Wt 264.0 lb

## 2021-12-26 DIAGNOSIS — G4733 Obstructive sleep apnea (adult) (pediatric): Secondary | ICD-10-CM

## 2021-12-26 DIAGNOSIS — R0602 Shortness of breath: Secondary | ICD-10-CM

## 2021-12-26 LAB — PULMONARY FUNCTION TEST
DL/VA % pred: 113 %
DL/VA: 4.87 ml/min/mmHg/L
DLCO cor % pred: 106 %
DLCO cor: 31.09 ml/min/mmHg
DLCO unc % pred: 106 %
DLCO unc: 31.09 ml/min/mmHg
FEF 25-75 Post: 4.27 L/sec
FEF 25-75 Pre: 3.71 L/sec
FEF2575-%Change-Post: 15 %
FEF2575-%Pred-Post: 130 %
FEF2575-%Pred-Pre: 113 %
FEV1-%Change-Post: 4 %
FEV1-%Pred-Post: 100 %
FEV1-%Pred-Pre: 95 %
FEV1-Post: 3.88 L
FEV1-Pre: 3.71 L
FEV1FVC-%Change-Post: 4 %
FEV1FVC-%Pred-Pre: 106 %
FEV6-%Change-Post: 1 %
FEV6-%Pred-Post: 94 %
FEV6-%Pred-Pre: 93 %
FEV6-Post: 4.58 L
FEV6-Pre: 4.54 L
FEV6FVC-%Change-Post: 0 %
FEV6FVC-%Pred-Post: 104 %
FEV6FVC-%Pred-Pre: 103 %
FVC-%Change-Post: 0 %
FVC-%Pred-Post: 90 %
FVC-%Pred-Pre: 90 %
FVC-Post: 4.58 L
FVC-Pre: 4.57 L
Post FEV1/FVC ratio: 85 %
Post FEV6/FVC ratio: 100 %
Pre FEV1/FVC ratio: 81 %
Pre FEV6/FVC Ratio: 99 %
RV % pred: 96 %
RV: 2.14 L
TLC % pred: 92 %
TLC: 6.66 L

## 2021-12-26 NOTE — Patient Instructions (Addendum)
Please schedule follow up scheduled with myself in approx 4 months.  If my schedule is not open yet, we will contact you with a reminder closer to that time. Please call [phone] if you haven't heard from [place] a month before.  ? ?Before your next visit I would like you to have: ?Your home sleep apnea test. Hopefully this gives [place] an answer and I can prescribe CPAP for you. ? ?I need to see you between 31-90 days of receiving and starting to use your CPAP machine. Please reschedule your appointment accordingly if it does not fall within this window.  ? ?If the home sleep test does not give [place] an answer - we will need to do an in lab study and I will order that for you if needed.  ?

## 2021-12-26 NOTE — Progress Notes (Addendum)
Timothy [name]    [id number]    [birth year]  Primary Care Physician:Paz, Alda Berthold, MD Date of Appointment: 12/26/2021 Established Patient Visit  Chief complaint:   Chief Complaint  Patient presents with   Follow-up    Get PFT results.     HPI: Timothy [name] is a 56 y.o. man with shortness of breath and fatigue.   Interval Updates: Here for follow up after PFTs. Normal pulmonary function.   Home sleep test was ordered - still not scheduled.   Still feeling fatigued.  I have reviewed the patient's family social and past medical history and updated as appropriate.   Past Medical History:  Diagnosis Date   Allergic rhinitis    Allergy    CAD (coronary artery disease), native coronary artery    Coronary CTA showed minimal coronary Ca at 5 with minimal CAD with < 25% stenosis in the RCA and LAD 05/2021   Chest pain 04/09/2008   stress test normal and echo (NI), GB US negative had a previous eval by cards as well   COVID-19    Erectile dysfunction    GERD (gastroesophageal reflux disease)    Hypertriglyceridemia    Right groin pain 2016   Dr. Jeffie Pollock, plan is renal US, and cystoscopy   Shingles 03/2009   Spinal stenosis at L4-L5 level    moderate   Spondylolisthesis    slight, across L4-5   Undescended right testicle    s/p surgery as a child---us 11-11 "absent right testcile"    Past Surgical History:  Procedure Laterality Date   FRACTURE SURGERY     left leg   OTHER SURGICAL HISTORY     undescended testicle surgery, right   VASECTOMY  2009    Family History  Problem Relation Age of Onset   Asthma Mother    Allergies Mother    Lung cancer Mother    Diabetes Father    CAD Father    Breast cancer Sister    Cancer Brother        bone    Colon cancer Neg Hx    Prostate cancer Neg Hx    Esophageal cancer Neg Hx    Rectal cancer Neg Hx    Stomach cancer Neg Hx     Social History   Occupational History   Occupation: controller  Tobacco Use    Smoking status: Never   Smokeless tobacco: Former    Types: Nurse, children's Use: Never used  Substance and Sexual Activity   Alcohol use: Yes    Alcohol/week: 3.0 standard drinks    Types: 3 Cans of beer per week    Comment: socially    Drug use: No   Sexual activity: Not on file     Physical Exam: Blood pressure 108/70, pulse 71, temperature 98 F (36.7 C), temperature source Oral, height '5\' 11"'$  (1.803 m), weight 264 lb (119.7 kg), SpO2 100 %.  Gen:      No acute distress ENT:  mallampati IV, no nasal polyps, mucus membranes moist Lungs:    No increased respiratory effort, symmetric chest wall excursion, clear to auscultation bilaterally, no wheezes or crackles CV:         Regular rate and rhythm; no murmurs, rubs, or gallops.  No pedal edema   Data Reviewed: Imaging: I have personally reviewed the   PFTs:     Latest Ref Rng & Units 12/26/2021  9:00 AM  PFT Results  FVC-Pre L 4.57  P  FVC-Predicted Pre % 90  P  FVC-Post L 4.58  P  FVC-Predicted Post % 90  P  Pre FEV1/FVC % % 81  P  Post FEV1/FCV % % 85  P  FEV1-Pre L 3.71  P  FEV1-Predicted Pre % 95  P  FEV1-Post L 3.88  P  DLCO uncorrected ml/min/mmHg 31.09  P  DLCO UNC% % 106  P  DLCO corrected ml/min/mmHg 31.09  P  DLCO COR %Predicted % 106  P  DLVA Predicted % 113  P  TLC L 6.66  P  TLC % Predicted % 92  P  RV % Predicted % 96  P    P Preliminary result   I have personally reviewed the patient's PFTs and normal pulmonary function.   Labs: Lab Results  Component Value Date   WBC 7.5 08/29/2021   HGB 15.5 08/29/2021   HCT 44.2 08/29/2021   MCV 87 08/29/2021   PLT 244 08/29/2021   Lab Results  Component Value Date   NA 140 05/07/2021   K 4.2 05/07/2021   CL 105 05/07/2021   CO2 22 05/07/2021    Immunization status: Immunization History  Administered Date(s) Administered   PFIZER(Purple Top)SARS-COV-2 Vaccination 10/24/2019, 11/14/2019   Td 12/10/2003   Tdap 12/27/2015     External Records Personally Reviewed:   Assessment:  OSA  Plan/Recommendations: Normal PFT.  Proceed with HSAT. If negative needs in lab. Will prescribe PAP therapy if needed and follow up after this.   Addendum: Patient's sleep study shows mild sleep apnea with low oxygen levels at night. Because he is very fatigued and symptomatic I think this warrants a trial of therapy. He has symptoms of excessive daytime sleepiness and daytime somnolence.    Please prescribe auto titrating CPAP with a pressure setting of 5-15, medium face mask with tubing and supplies.    Needs appointment 31-90 days from initiation of therapy to follow up.   Return to Care: Return in about 4 months (around 04/28/2022).   Lenice Llamas, MD Pulmonary [facility] [place]

## 2021-12-26 NOTE — Progress Notes (Signed)
PFT done today. 

## 2021-12-30 ENCOUNTER — Encounter: Payer: Self-pay | Admitting: Internal Medicine

## 2021-12-30 ENCOUNTER — Ambulatory Visit: Payer: BC Managed Care – PPO

## 2021-12-30 ENCOUNTER — Ambulatory Visit: Payer: BC Managed Care – PPO | Admitting: Internal Medicine

## 2021-12-30 VITALS — BP 128/68 | HR 64 | Temp 98.1°F | Resp 18 | Ht 71.0 in | Wt 256.4 lb

## 2021-12-30 DIAGNOSIS — R0602 Shortness of breath: Secondary | ICD-10-CM

## 2021-12-30 DIAGNOSIS — R0789 Other chest pain: Secondary | ICD-10-CM

## 2021-12-30 DIAGNOSIS — R5383 Other fatigue: Secondary | ICD-10-CM | POA: Diagnosis not present

## 2021-12-30 DIAGNOSIS — G4733 Obstructive sleep apnea (adult) (pediatric): Secondary | ICD-10-CM | POA: Diagnosis not present

## 2021-12-30 NOTE — Patient Instructions (Addendum)
? ? ?  GO TO THE FRONT DESK, PLEASE SCHEDULE YOUR APPOINTMENTS ?Come back for  a physical in 4 months  ?

## 2021-12-30 NOTE — Progress Notes (Signed)
? ?Subjective:  ? ? Patient ID: Timothy [name], male    DOB: 1966-03-28, 56 y.o.   MRN: [id number] ? ?DOS:  12/30/2021 ?Type of visit - description: rov ? ?Follow-up from previous visit, he reported fatigue, DOE and chest pain. ?Extensive cardiovascular work-up pulmonary work-up was done.  Chart is reviewed. ? ?At this point he denies chest pain. ?I ask about physical activity and the only exercise he does is yardwork. ?Previously was able to work "all day" on his yard and felt tired  but okay. ?At this point, he feels very fatigued after only working  half  day. ? ? ?Review of Systems ?See above  ? ?Past Medical History:  ?Diagnosis Date  ? Allergic rhinitis   ? Allergy   ? CAD (coronary artery disease), native coronary artery   ? Coronary CTA showed minimal coronary Ca at 5 with minimal CAD with < 25% stenosis in the RCA and LAD 05/2021  ? Chest pain 04/09/2008  ? stress test normal and echo (NI), GB US negative had a previous eval by cards as well  ? COVID-19   ? Erectile dysfunction   ? GERD (gastroesophageal reflux disease)   ? Hypertriglyceridemia   ? Right groin pain 2016  ? Dr. Jeffie Pollock, plan is renal US, and cystoscopy  ? Shingles 03/2009  ? Spinal stenosis at L4-L5 level   ? moderate  ? Spondylolisthesis   ? slight, across L4-5  ? Undescended right testicle   ? s/p surgery as a child---us 11-11 "absent right testcile"  ? ? ?Past Surgical History:  ?Procedure Laterality Date  ? FRACTURE SURGERY    ? left leg  ? OTHER SURGICAL HISTORY    ? undescended testicle surgery, right  ? VASECTOMY  2009  ? ? ?Current Outpatient Medications  ?Medication Instructions  ? esomeprazole (NEXIUM) 40 MG capsule TAKE 1 CAPSULE BY MOUTH EVERY DAY BEFORE BREAKFAST  ? fluticasone (FLONASE) 50 MCG/ACT nasal spray 2 sprays, Each Nare, Daily  ? ibuprofen (ADVIL) 800 mg, Oral, 2 times daily PRN  ? levocetirizine (XYZAL) 5 MG tablet TAKE 1 TABLET BY MOUTH EVERY DAY IN THE EVENING  ? rosuvastatin (CRESTOR) 10 mg, Oral, Daily  ? tadalafil  (CIALIS) 5 mg, Oral, Daily  ? traZODone (DESYREL) 50 mg, Oral, Daily at bedtime  ? ? ?   ?Objective:  ? Physical Exam ?BP 128/68 (BP Location: Left Arm, Patient Position: Sitting, Cuff Size: Normal)   Pulse 64   Temp 98.1 ?F (36.7 ?C) (Oral)   Resp 18   Ht '5\' 11"'$  (1.803 m)   Wt 256 lb 6 oz (116.3 kg)   SpO2 97%   BMI 35.76 kg/m?  ?General:   ?Well developed, NAD, BMI noted. ?HEENT:  ?Normocephalic . Face symmetric, atraumatic ?Lungs:  ?CTA B ?Normal respiratory effort, no intercostal retractions, no accessory muscle use. ?Heart: RRR,  no murmur.  ?Lower extremities: no pretibial edema bilaterally  ?Skin: Not pale. Not jaundice ?Neurologic:  ?alert & oriented X3.  ?Speech normal, gait appropriate for age and unassisted ?Psych--  ?Cognition and judgment appear intact.  ?Cooperative with normal attention span and concentration.  ?Behavior appropriate. ?No anxious or depressed appearing.  ? ?   ?Assessment   ? ? Assessment ?Dyslipidemia ?GERD ?Chest pain 2009, normal echo/ stress test/ GB US ?Shingles 2010 ?ED  ?GU: ?--Undescended testicle, R, s/p unsuccessful surgery. ?-- UTI recurrent (third UTI dx 12-2014), groin pain >>>saw urology 02-2015-- rx [place] ?--Increased PSA 12-2014 in the context of a  UTI, saw urology 02-2015, subsequent PSAs wnl ?MSK: chronic  back pain, onset ~2015, s/p local injection x 1 ~ 08-2014 ([facility]) ?Aspirin allergy (able to take ibuprofen regularly) ? Fatigue, DOE, chest pain, saw cardiology 04-2021. Work-up included: Coronary calcium score: 5, minimal CAD. Echo with a question of HOCM,   cardiac MRI done: no HCOM but did notice a hypermobile interatrial septum, Rx echo: No PFO.  ? ?PLAN: ?Fatigue, DOE, chest pain: ?Saw cardiology 04-2021.  Work-up included an echo with a question of HOCM, thus cardiac MRI done: no HCOM.  They did notice a hypermobile interatrial septum, Rx echo: No PFO.   ?Saw pulmonary, PFTs normal,home sleep study pending. ?At this point, he continue with mostly fatigue and  feeling tired.  Extensive work-up has been essentially negative. ?If the sleep study shows sleep apnea, then wearing a CPAP will be extremely beneficial.  That being said, part of the issue is deconditioning so I strongly encouraged him to start a gradual exercise program. ?Preventive care: Had a colonoscopy 09-2021, next 3 years due to polyps ?RTC 4 months CPX  ?

## 2021-12-31 NOTE — Assessment & Plan Note (Signed)
Fatigue, DOE, chest pain: ?Saw cardiology 04-2021.  Work-up included an echo with a question of HOCM, thus cardiac MRI done: no HCOM.  They did notice a hypermobile interatrial septum, Rx echo: No PFO.   ?Saw pulmonary, PFTs normal,home sleep study pending. ?At this point, he continue with mostly fatigue and feeling tired.  Extensive work-up has been essentially negative. ?If the sleep study shows sleep apnea, then wearing a CPAP will be extremely beneficial.  That being said, part of the issue is deconditioning so I strongly encouraged him to start a gradual exercise program. ?Preventive care: Had a colonoscopy 09-2021, next 3 years due to polyps ?RTC 4 months CPX  ?

## 2022-01-07 ENCOUNTER — Encounter: Payer: Self-pay | Admitting: Internal Medicine

## 2022-01-10 ENCOUNTER — Telehealth: Payer: Self-pay | Admitting: Pulmonary Disease

## 2022-01-10 DIAGNOSIS — G4733 Obstructive sleep apnea (adult) (pediatric): Secondary | ICD-10-CM | POA: Diagnosis not present

## 2022-01-10 NOTE — Telephone Encounter (Signed)
Call patient  Sleep study result  Date of study: 12/30/2021  Impression: Mild obstructive sleep apnea Mild oxygen desaturations  Recommendation: Options of treatment for mild obstructive sleep apnea will include  1.  CPAP therapy if there is significant daytime sleepiness or other comorbidities including history of CVA or cardiac disease  -If CPAP is chosen as an option of treatment auto titrating CPAP with a pressure setting of 5-15 will be appropriate  2.  Watchful waiting with emphasis on weight loss measures, sleep position modification to optimize lateral sleep, elevating the head of the bed by about 30 degrees may also help.  3.  An oral device may be fashioned for the treatment of mild sleep disordered breathing, will involve referral to dentist.  Follow-up as previously scheduled  Schedule for follow-up to discuss findings and options of treatment prior to initiating therapy

## 2022-01-13 NOTE — Telephone Encounter (Addendum)
Patient's sleep study shows mild sleep apnea with low oxygen levels at night. Because he is very fatigued and symptomatic I think this warrants a trial of therapy. He has symptoms of excessive daytime sleepiness and daytime somnolence.   Please prescribe auto titrating CPAP with a pressure setting of 5-15, medium face mask with tubing and supplies.   Needs appointment 31-90 days from initiation of therapy to follow up.

## 2022-01-13 NOTE — Telephone Encounter (Signed)
I called the patient and left a message for the patient to call back for his results.

## 2022-01-19 NOTE — Telephone Encounter (Signed)
Spoke with the pt and notified of results of sleep study. Pt verbalized understanding and was agreeable to CPAP therapy. I have placed DME referral for this and pt aware to contact the office for 31-90 day f/u once they begin using machine per insurance requirement.   

## 2022-01-19 NOTE — Telephone Encounter (Signed)
Following up - did this cpap get ordered yet?

## 2022-02-24 DIAGNOSIS — G4733 Obstructive sleep apnea (adult) (pediatric): Secondary | ICD-10-CM | POA: Diagnosis not present

## 2022-03-10 ENCOUNTER — Other Ambulatory Visit: Payer: Self-pay | Admitting: Internal Medicine

## 2022-03-13 ENCOUNTER — Other Ambulatory Visit: Payer: Self-pay | Admitting: Cardiology

## 2022-03-25 ENCOUNTER — Other Ambulatory Visit: Payer: Self-pay | Admitting: Internal Medicine

## 2022-03-27 DIAGNOSIS — G4733 Obstructive sleep apnea (adult) (pediatric): Secondary | ICD-10-CM | POA: Diagnosis not present

## 2022-04-04 ENCOUNTER — Other Ambulatory Visit: Payer: Self-pay | Admitting: Internal Medicine

## 2022-04-11 DIAGNOSIS — G4733 Obstructive sleep apnea (adult) (pediatric): Secondary | ICD-10-CM | POA: Diagnosis not present

## 2022-04-27 DIAGNOSIS — G4733 Obstructive sleep apnea (adult) (pediatric): Secondary | ICD-10-CM | POA: Diagnosis not present

## 2022-05-07 ENCOUNTER — Other Ambulatory Visit: Payer: Self-pay | Admitting: Family

## 2022-05-12 DIAGNOSIS — G4733 Obstructive sleep apnea (adult) (pediatric): Secondary | ICD-10-CM | POA: Diagnosis not present

## 2022-05-13 ENCOUNTER — Other Ambulatory Visit: Payer: Self-pay | Admitting: Internal Medicine

## 2022-05-13 MED ORDER — ESOMEPRAZOLE MAGNESIUM 40 MG PO CPDR: 40.0000 mg | DELAYED_RELEASE_CAPSULE | Freq: Every day | ORAL | 0 refills | Status: DC

## 2022-05-23 ENCOUNTER — Other Ambulatory Visit: Payer: Self-pay | Admitting: Internal Medicine

## 2022-05-27 DIAGNOSIS — G4733 Obstructive sleep apnea (adult) (pediatric): Secondary | ICD-10-CM | POA: Diagnosis not present

## 2022-06-08 IMAGING — MR MR CARD MORPHOLOGY WO/W CM
45 of 48 series · 45 of 48 positions shown · IV contrast (Contrast agent)
Comparison: none

CLINICAL DATA: Clinical question of hypertrophic cardiomyopathy
Study assumes HCT of 44 and BSA of 2.48.

EXAM:
CARDIAC MRI
TECHNIQUE: The patient was scanned on a 1.5 Tesla GE magnet. A dedicated
cardiac coil was used. Functional imaging was done using Fiesta
sequences. [DATE], and 4 chamber views were done to assess for RWMA's.
Modified Tofig rule using a short axis stack was used to
calculate an ejection fraction on a dedicated work station using
Circle software. The patient received 10 cc of Gadavist. After 10
minutes inversion recovery sequences were used to assess for
infiltration and scar tissue.
CONTRAST:  10 cc of Gadavist

[Series 4: [id number] · axial · 8.0mm · 1.48mm/px · 1 of 18 slices shown]
[im 1/18]
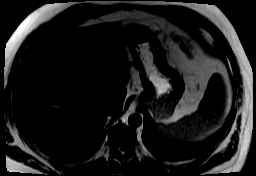

[Series 8: bSSFP · oblique · 8.0mm · 1.83mm/px · 1 of 25 slices shown (1 of 21)]
[im 1/25]
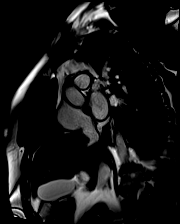

[Series 9: bSSFP · oblique · 8.0mm · 1.83mm/px · 1 of 25 slices shown (2 of 21)]
[im 1/25]
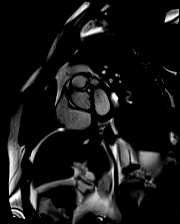

[Series 10: bSSFP · oblique · 8.0mm · 1.83mm/px · 1 of 25 slices shown (3 of 21)]
[im 1/25]
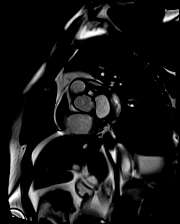

[Series 11: bSSFP · oblique · 8.0mm · 1.83mm/px · 1 of 25 slices shown (4 of 21)]
[im 1/25]
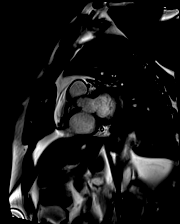

[Series 12: bSSFP · oblique · 8.0mm · 1.83mm/px · 1 of 25 slices shown (5 of 21)]
[im 1/25]
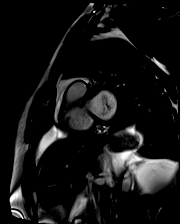

[Series 13: bSSFP · oblique · 8.0mm · 1.83mm/px · 1 of 25 slices shown (6 of 21)]
[im 1/25]
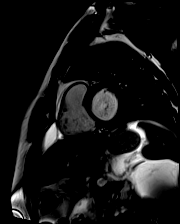

[Series 14: bSSFP · oblique · 8.0mm · 1.83mm/px · 1 of 25 slices shown (7 of 21)]
[im 1/25]
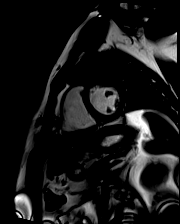

[Series 15: bSSFP · oblique · 8.0mm · 1.83mm/px · 1 of 25 slices shown (8 of 21)]
[im 1/25]
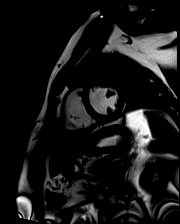

[Series 16: bSSFP · oblique · 8.0mm · 1.83mm/px · 1 of 25 slices shown (9 of 21)]
[im 1/25]
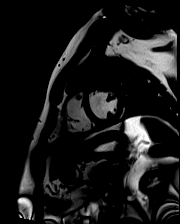

[Series 17: bSSFP · oblique · 8.0mm · 1.83mm/px · 1 of 25 slices shown (10 of 21)]
[im 1/25]
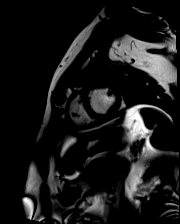

[Series 18: bSSFP · oblique · 8.0mm · 1.83mm/px · 1 of 25 slices shown (11 of 21)]
[im 1/25]
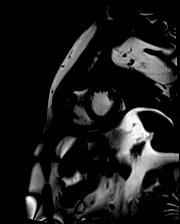

[Series 19: bSSFP · oblique · 8.0mm · 1.83mm/px · 1 of 25 slices shown (12 of 21)]
[im 1/25]
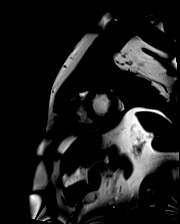

[Series 20: bSSFP · oblique · 8.0mm · 1.83mm/px · 1 of 25 slices shown (13 of 21)]
[im 1/25]
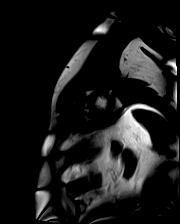

[Series 21: bSSFP · oblique · 8.0mm · 1.83mm/px · 1 of 25 slices shown (14 of 21)]
[im 1/25]
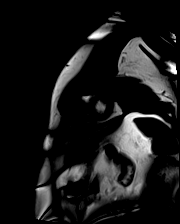

[Series 22: bSSFP · oblique · 8.0mm · 1.83mm/px · 1 of 25 slices shown (15 of 21)]
[im 1/25]
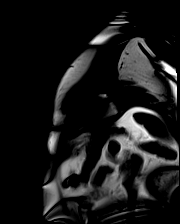

[Series 23: bSSFP · oblique · 8.0mm · 1.83mm/px · 1 of 25 slices shown (16 of 21)]
[im 1/25]
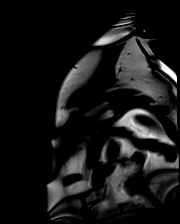

[Series 24: bSSFP · oblique · 8.0mm · 1.83mm/px · 1 of 25 slices shown (17 of 21)]
[im 1/25]
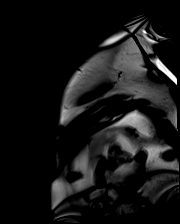

[Series 25: bSSFP · oblique · 6.0mm · 1.41mm/px · 1 of 25 slices shown (18 of 21)]
[im 1/25]
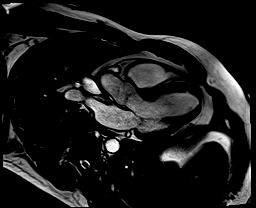

[Series 26: bSSFP · oblique · 6.0mm · 1.41mm/px · 1 of 25 slices shown (19 of 21)]
[im 1/25]
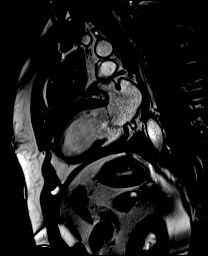

[Series 27: bSSFP · axial · 6.0mm · 1.41mm/px · 1 of 25 slices shown (20 of 21)]
[im 1/25]
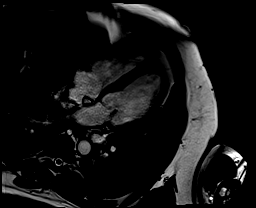

[Series 28: (id)_long_t1 · oblique · 8.0mm · 1.56mm/px · 1 of 24 slices shown]
[im 1/24]
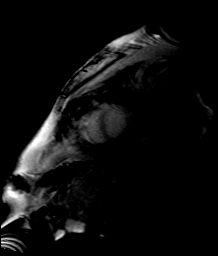

[Series 29: (id)_long_t1_moco · oblique · 8.0mm · 1.56mm/px · 1 of 24 slices shown]
[im 1/24]
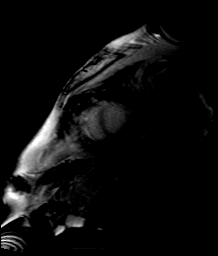

[Series 32: (id)_trufi · oblique · 8.0mm · 2.08mm/px · 1 of 9 slices shown]
[im 1/9]
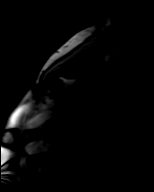

[Series 33: (id)_trufi_moco · oblique · 8.0mm · 2.08mm/px · 1 of 9 slices shown]
[im 1/9]
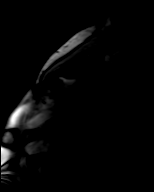

[Series 36: pre short axis · oblique · non-contrast · 8.0mm · 2.25mm/px · 1 of 10 slices shown (1 of 6)]
[im 1/10]
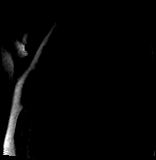

[Series 37: pre short axis · oblique · non-contrast · 8.0mm · 2.25mm/px · 1 of 10 slices shown (2 of 6)]
[im 1/10]
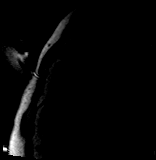

[Series 38: pre short axis · oblique · non-contrast · 8.0mm · 2.25mm/px · 1 of 10 slices shown (3 of 6)]
[im 1/10]
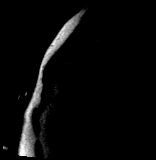

[Series 39: pre short axis · oblique · non-contrast · 8.0mm · 2.25mm/px · 1 of 10 slices shown (4 of 6)]
[im 1/10]
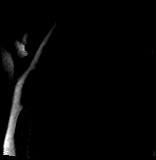

[Series 40: pre short axis · oblique · non-contrast · 8.0mm · 2.25mm/px · 1 of 10 slices shown (5 of 6)]
[im 1/10]
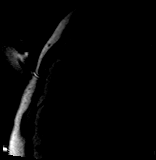

[Series 41: pre short axis · oblique · non-contrast · 8.0mm · 2.25mm/px · 1 of 10 slices shown (6 of 6)]
[im 1/10]
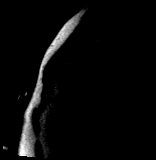

[Series 42: rest short axis · oblique · 8.0mm · 2.25mm/px · 1 of 60 slices shown (1 of 6)]
[im 1/60]
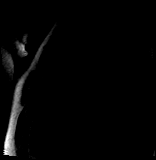

[Series 43: rest short axis · oblique · 8.0mm · 2.25mm/px · 1 of 60 slices shown (2 of 6)]
[im 1/60]
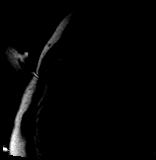

[Series 44: rest short axis · oblique · 8.0mm · 2.25mm/px · 1 of 60 slices shown (3 of 6)]
[im 1/60]
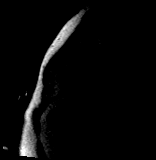

[Series 45: rest short axis · oblique · 8.0mm · 2.25mm/px · 1 of 60 slices shown (4 of 6)]
[im 1/60]
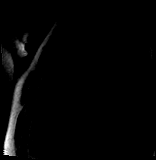

[Series 46: rest short axis · oblique · 8.0mm · 2.25mm/px · 1 of 60 slices shown (5 of 6)]
[im 1/60]
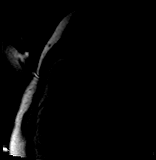

[Series 47: rest short axis · oblique · 8.0mm · 2.25mm/px · 1 of 60 slices shown (6 of 6)]
[im 1/60]
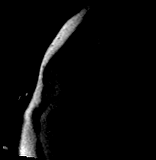

[Series 48: bSSFP · coronal · 6.0mm · 1.41mm/px · 1 of 25 slices shown (21 of 21)]
[im 1/25]
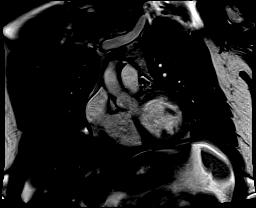

[Series 49: aortic valve cine · oblique · 6.0mm · 1.41mm/px · 1 of 25 slices shown]
[im 1/25]
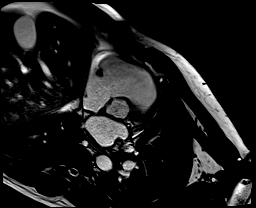

[Series 50: cine rvit · oblique · 6.0mm · 1.41mm/px · 1 of 25 slices shown]
[im 1/25]
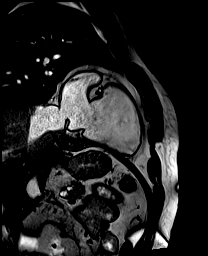

[Series 51: cine rvot · sagittal · 6.0mm · 1.41mm/px · 1 of 25 slices shown]
[im 1/25]
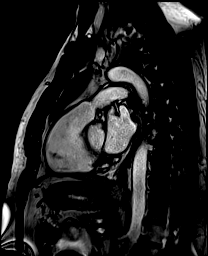

[Series 53: lge_single shot sa · oblique · 8.0mm · 2.08mm/px · 1 of 18 slices shown (1 of 2)]
[im 1/18]
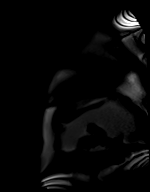

[Series 54: lge_single shot sa · oblique · 8.0mm · 2.08mm/px · 1 of 18 slices shown (2 of 2)]
[im 1/18]
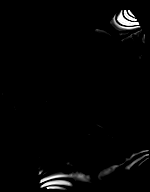

[Series 57: lge_single shot 4 · axial · 6.0mm · 1.98mm/px · 1 of 1 slices shown (1 of 2)]
[im 1/1]
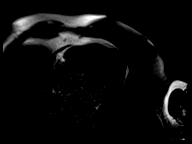

[Series 58: lge_single shot 4 · axial · 6.0mm · 1.98mm/px · 1 of 1 slices shown (2 of 2)]
[im 1/1]
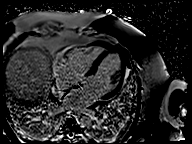

[45 of 48 positions shown; findings below may reference images not displayed]

FINDINGS: 1. Normal left ventricular size, with LVEDD 63 mm, but LVEDVi 81
mL/m2.

Intraventricular septal thickness of 13 mm, posterior wall thickness
of 7 mm, and myocardial mass index of 50 g/m2 (normal).

Low normal left ventricular systolic function (LVEF =54 %). There
are no regional wall motion abnormalities.

Left ventricular parametric mapping notable for normal ECV signal
and normal T2 signal.

There is no late gadolinium enhancement in the left ventricular
myocardium.

2.  Mild dilated right ventricular size with RVEDVI 94 mL/m2.

Normal right ventricular thickness.

Low normal right ventricular systolic function (RVEF =47%). There
are no regional wall motion abnormalities or aneurysms.

3.  Normal left and right atrial size.

Atrial septal aneurysm noted.

Cannot exclude small PFO.

4. Normal size of the aortic root, ascending aorta and pulmonary
artery.

5. Valve assessment:

Aortic Valve: Tri-leaflet aortic valve. Qualitatively no significant
regurgitation.

Pulmonic Valve: Qualitatively no significant regurgitation.

Tricuspid Valve:Qualitatively no significant regurgitation.

Mitral Valve: No significant regurgitation.

6.  Normal pericardium.  No pericardial effusion.

7. Grossly, no extracardiac findings. Recommended dedicated study if
concerned for non-cardiac pathology.
IMPRESSION: For patients without family history, study does not meet diagnostic
criteria for hypertrophic cardiomyopathy.

## 2022-06-11 DIAGNOSIS — G4733 Obstructive sleep apnea (adult) (pediatric): Secondary | ICD-10-CM | POA: Diagnosis not present

## 2022-06-16 ENCOUNTER — Other Ambulatory Visit: Payer: Self-pay | Admitting: Internal Medicine

## 2022-06-16 ENCOUNTER — Encounter: Payer: Self-pay | Admitting: Internal Medicine

## 2022-06-20 ENCOUNTER — Other Ambulatory Visit: Payer: Self-pay | Admitting: Internal Medicine

## 2022-06-23 ENCOUNTER — Other Ambulatory Visit: Payer: Self-pay | Admitting: Internal Medicine

## 2022-06-27 DIAGNOSIS — G4733 Obstructive sleep apnea (adult) (pediatric): Secondary | ICD-10-CM | POA: Diagnosis not present

## 2022-07-01 ENCOUNTER — Other Ambulatory Visit: Payer: Self-pay

## 2022-07-01 MED ORDER — ROSUVASTATIN CALCIUM 10 MG PO TABS: 10.0000 mg | ORAL_TABLET | Freq: Every day | ORAL | 0 refills | Status: DC

## 2022-07-02 ENCOUNTER — Other Ambulatory Visit: Payer: Self-pay

## 2022-07-02 MED ORDER — IBUPROFEN 800 MG PO TABS: 800.0000 mg | ORAL_TABLET | Freq: Two times a day (BID) | ORAL | 0 refills | Status: DC | PRN

## 2022-07-03 ENCOUNTER — Other Ambulatory Visit: Payer: Self-pay

## 2022-07-03 MED ORDER — IBUPROFEN 800 MG PO TABS: 800.0000 mg | ORAL_TABLET | Freq: Two times a day (BID) | ORAL | 0 refills | Status: DC | PRN

## 2022-07-09 ENCOUNTER — Other Ambulatory Visit: Payer: Self-pay | Admitting: Cardiology

## 2022-07-10 ENCOUNTER — Other Ambulatory Visit: Payer: Self-pay | Admitting: Cardiology

## 2022-07-21 ENCOUNTER — Other Ambulatory Visit: Payer: Self-pay | Admitting: Internal Medicine

## 2022-07-23 ENCOUNTER — Other Ambulatory Visit: Payer: Self-pay | Admitting: Cardiology

## 2022-07-27 DIAGNOSIS — G4733 Obstructive sleep apnea (adult) (pediatric): Secondary | ICD-10-CM | POA: Diagnosis not present

## 2022-07-28 ENCOUNTER — Other Ambulatory Visit: Payer: Self-pay | Admitting: Internal Medicine

## 2022-07-29 ENCOUNTER — Other Ambulatory Visit: Payer: Self-pay | Admitting: Internal Medicine

## 2022-08-03 ENCOUNTER — Ambulatory Visit: Payer: BC Managed Care – PPO | Admitting: Internal Medicine

## 2022-08-03 ENCOUNTER — Encounter: Payer: Self-pay | Admitting: Internal Medicine

## 2022-08-03 VITALS — BP 128/80 | HR 73 | Temp 98.1°F | Resp 18 | Ht 71.0 in | Wt 274.0 lb

## 2022-08-03 DIAGNOSIS — Z125 Encounter for screening for malignant neoplasm of prostate: Secondary | ICD-10-CM

## 2022-08-03 DIAGNOSIS — E785 Hyperlipidemia, unspecified: Secondary | ICD-10-CM | POA: Diagnosis not present

## 2022-08-03 DIAGNOSIS — Z0000 Encounter for general adult medical examination without abnormal findings: Secondary | ICD-10-CM

## 2022-08-03 MED ORDER — ESOMEPRAZOLE MAGNESIUM 40 MG PO CPDR: 40.0000 mg | DELAYED_RELEASE_CAPSULE | Freq: Every day | ORAL | 3 refills | Status: DC

## 2022-08-03 MED ORDER — TADALAFIL 5 MG PO TABS: 5.0000 mg | ORAL_TABLET | Freq: Every day | ORAL | 3 refills | Status: DC

## 2022-08-03 MED ORDER — IBUPROFEN 800 MG PO TABS: 800.0000 mg | ORAL_TABLET | Freq: Two times a day (BID) | ORAL | 6 refills | Status: DC | PRN

## 2022-08-03 NOTE — Assessment & Plan Note (Signed)
-   Td 2017 -COVID-vax- declines . -Shingrex  discussed -Has declined flu shot consistently --CCS:  cscope 06/2017, cscope 09-2021, next per GI -Prostate cancer screening: declined DRE, check PSA --counseled about diet-exercise.  Encouraged to with a goal of lose 5% of his weight. --labs : CMP FLP CBC PSA

## 2022-08-03 NOTE — Progress Notes (Signed)
Subjective:    Patient ID: Timothy [name], male    DOB: 1965-10-31, 56 y.o.   MRN: [id number]  DOS:  08/03/2022 Type of visit - description: CPX  In general feeling well. Has aches and pains, on ibuprofen and Tylenol daily. Denies any GI side effects.  Review of Systems  Other than above, a 14 point review of systems is negative     Past Medical History:  Diagnosis Date   Allergic rhinitis    Allergy    CAD (coronary artery disease), native coronary artery    Coronary CTA showed minimal coronary Ca at 5 with minimal CAD with < 25% stenosis in the RCA and LAD 05/2021   Chest pain 04/09/2008   stress test normal and echo (NI), GB US negative had a previous eval by cards as well   COVID-19    Erectile dysfunction    GERD (gastroesophageal reflux disease)    Hypertriglyceridemia    Right groin pain 2016   Dr. Jeffie Pollock, plan is renal US, and cystoscopy   Shingles 03/2009   Spinal stenosis at L4-L5 level    moderate   Spondylolisthesis    slight, across L4-5   Undescended right testicle    s/p surgery as a child---us 11-11 "absent right testcile"    Past Surgical History:  Procedure Laterality Date   FRACTURE SURGERY     left leg   OTHER SURGICAL HISTORY     undescended testicle surgery, right   VASECTOMY  2009   Social History   Socioeconomic History   Marital status: Married    Spouse name: Not on file   Number of children: 1   Years of education: Not on file   Highest education level: Not on file  Occupational History   Occupation: controller  Tobacco Use   Smoking status: Never   Smokeless tobacco: Former    Types: Nurse, children's Use: Never used  Substance and Sexual Activity   Alcohol use: Yes    Alcohol/week: 3.0 standard drinks of alcohol    Types: 3 Cans of beer per week    Comment: socially    Drug use: No   Sexual activity: Not on file  Other Topics Concern   Not on file  Social History Narrative   Child 25   Divorced, re  married 01-2016   Social Determinants of Health   Financial Resource Strain: Not on file  Food Insecurity: Not on file  Transportation Needs: Not on file  Physical Activity: Not on file  Stress: Not on file  Social Connections: Not on file  Intimate Partner Violence: Not on file     Current Outpatient Medications  Medication Instructions   esomeprazole (NEXIUM) 40 mg, Oral, Daily before breakfast   fluticasone (FLONASE) 50 MCG/ACT nasal spray 2 sprays, Each Nare, Daily   ibuprofen (ADVIL) 800 mg, Oral, 2 times daily PRN   levocetirizine (XYZAL) 5 MG tablet TAKE 1 TABLET BY MOUTH EVERY DAY IN THE EVENING   rosuvastatin (CRESTOR) 10 mg, Oral, Daily   tadalafil (CIALIS) 5 mg, Oral, Daily   traZODone (DESYREL) 50 mg, Oral, Daily at bedtime       Objective:   Physical Exam BP 128/80   Pulse 73   Temp 98.1 F (36.7 C) (Oral)   Resp 18   Ht '5\' 11"'$  (1.803 m)   Wt 274 lb (124.3 kg)   SpO2 97%   BMI 38.22 kg/m  General: Well developed, NAD,  BMI noted Neck: No  thyromegaly  HEENT:  Normocephalic . Face symmetric, atraumatic Lungs:  CTA B Normal respiratory effort, no intercostal retractions, no accessory muscle use. Heart: RRR,  no murmur.  Abdomen:  Not distended, soft, non-tender. No rebound or rigidity. DRE: Declines Lower extremities: no pretibial edema bilaterally  Skin: Exposed areas without rash. Not pale. Not jaundice Neurologic:  alert & oriented X3.  Speech normal, gait appropriate for age and unassisted Strength symmetric and appropriate for age.  Psych: Cognition and judgment appear intact.  Cooperative with normal attention span and concentration.  Behavior appropriate. No anxious or depressed appearing.     Assessment    Assessment Dyslipidemia GERD Shingles 2010 ED  GU: --Undescended testicle, R, s/p unsuccessful surgery. -- UTI recurrent (third UTI dx 12-2014), groin pain >>>saw urology 02-2015-- rx [place] --Increased PSA 12-2014 in the context of  a UTI, saw urology 02-2015, subsequent PSAs wnl Chronic  back pain onset ~2015, s/p local injection x 1 ~ 08-2014 ([facility]) Fatigue, DOE,CP: cards eval  04-2021. Coronary calcium score: 5, minimal CAD. Echo with a question of HOCM,   cardiac MRI done: no HCOM but did notice a hypermobile interatrial septum, Rx echo: No PFO. Saw pulmonary: PFTs normal, sleep study + OSA, Rx CPAP. OSA: See above Aspirin allergy (able to take ibuprofen regularly)   PLAN: Here for CPX Dyslipidemia: On rosuvastatin, checking labs Chest pain, DOE, fatigue:  Feeling much better OSA: Has not been able to use CPAP consistently, encouraged to persist, consider use trazodone the first few weeks.  Consider newer implantable devices if unable to get used to the CPAP. Chronic back pain: takes ibuprofen 800 mg daily, encourage good GI precautions, decrease to 400 mg if possible. RTC 1 year

## 2022-08-03 NOTE — Patient Instructions (Addendum)
Vaccines I recommend:  Flu shot  Covid booster Shingles shot  When you take ibuprofen, make sure you take it with a full stomach     GO TO THE LAB : Get the blood work     [place], [place] back for physical exam in 1 year

## 2022-08-03 NOTE — Assessment & Plan Note (Signed)
Here for CPX Dyslipidemia: On rosuvastatin, checking labs Chest pain, DOE, fatigue:  Feeling much better OSA: Has not been able to use CPAP consistently, encouraged to persist, consider use trazodone the first few weeks.  Consider newer implantable devices if unable to get used to the CPAP. Chronic back pain: takes ibuprofen 800 mg daily, encourage good GI precautions, decrease to 400 mg if possible. RTC 1 year

## 2022-08-04 DIAGNOSIS — G4733 Obstructive sleep apnea (adult) (pediatric): Secondary | ICD-10-CM | POA: Diagnosis not present

## 2022-08-04 LAB — COMPREHENSIVE METABOLIC PANEL
ALT: 21 U/L (ref 0–53)
AST: 19 U/L (ref 0–37)
Albumin: 4.4 g/dL (ref 3.5–5.2)
Alkaline Phosphatase: 51 U/L (ref 39–117)
BUN: 16 mg/dL (ref 6–23)
CO2: 28 mEq/L (ref 19–32)
Calcium: 8.9 mg/dL (ref 8.4–10.5)
Chloride: 104 mEq/L (ref 96–112)
Creatinine, Ser: 0.98 mg/dL (ref 0.40–1.50)
GFR: 86.07 mL/min (ref >60.00)
Glucose, Bld: 100 mg/dL — ABNORMAL HIGH (ref 70–99)
Potassium: 4.2 mEq/L (ref 3.5–5.1)
Sodium: 139 mEq/L (ref 135–145)
Total Bilirubin: 0.5 mg/dL (ref 0.2–1.2)
Total Protein: 6.5 g/dL (ref 6.0–8.3)

## 2022-08-04 LAB — CBC WITH DIFFERENTIAL/PLATELET
Basophils Absolute: 0 10*3/uL (ref 0.0–0.1)
Basophils Relative: 0.5 % (ref 0.0–3.0)
Eosinophils Absolute: 0.2 10*3/uL (ref 0.0–0.7)
Eosinophils Relative: 2.4 % (ref 0.0–5.0)
HCT: 45.5 % (ref 39.0–52.0)
Hemoglobin: 15.6 g/dL (ref 13.0–17.0)
Lymphocytes Relative: 34.8 % (ref 12.0–46.0)
Lymphs Abs: 2.5 10*3/uL (ref 0.7–4.0)
MCHC: 34.2 g/dL (ref 30.0–36.0)
MCV: 87.8 fl (ref 78.0–100.0)
Monocytes Absolute: 0.6 10*3/uL (ref 0.1–1.0)
Monocytes Relative: 8.7 % (ref 3.0–12.0)
Neutro Abs: 3.8 10*3/uL (ref 1.4–7.7)
Neutrophils Relative %: 53.6 % (ref 43.0–77.0)
Platelets: 234 10*3/uL (ref 150.0–400.0)
RBC: 5.18 Mil/uL (ref 4.22–5.81)
RDW: 13.4 % (ref 11.5–15.5)
WBC: 7.1 10*3/uL (ref 4.0–10.5)

## 2022-08-04 LAB — LIPID PANEL
Cholesterol: 101 mg/dL (ref 0–200)
HDL: 42.9 mg/dL (ref >39.00)
NonHDL: 57.78
Total CHOL/HDL Ratio: 2
Triglycerides: 211 mg/dL — ABNORMAL HIGH (ref 0.0–149.0)
VLDL: 42.2 mg/dL — ABNORMAL HIGH (ref 0.0–40.0)

## 2022-08-04 LAB — PSA: PSA: 1.66 ng/mL (ref 0.10–4.00)

## 2022-08-04 LAB — LDL CHOLESTEROL, DIRECT: Direct LDL: 40 mg/dL

## 2022-08-10 ENCOUNTER — Other Ambulatory Visit: Payer: Self-pay | Admitting: Internal Medicine

## 2022-08-27 DIAGNOSIS — G4733 Obstructive sleep apnea (adult) (pediatric): Secondary | ICD-10-CM | POA: Diagnosis not present

## 2022-08-31 ENCOUNTER — Other Ambulatory Visit: Payer: Self-pay | Admitting: Cardiology

## 2022-09-23 ENCOUNTER — Encounter: Payer: Self-pay | Admitting: Internal Medicine

## 2022-09-27 DIAGNOSIS — G4733 Obstructive sleep apnea (adult) (pediatric): Secondary | ICD-10-CM | POA: Diagnosis not present

## 2022-10-13 LAB — HEMOGLOBIN A1C: Hemoglobin A1C: 5.2

## 2022-10-13 LAB — LIPID PANEL
Cholesterol: 88 (ref 0–200)
HDL: 34 — AB (ref 35–70)
LDL Cholesterol: 32
Triglycerides: 153 (ref 40–160)

## 2022-10-13 LAB — BASIC METABOLIC PANEL
BUN: 15 (ref 4–21)
Creatinine: 1 (ref 0.6–1.3)

## 2022-10-13 LAB — TSH: TSH: 1.33 (ref 0.41–5.90)

## 2022-10-26 DIAGNOSIS — G4733 Obstructive sleep apnea (adult) (pediatric): Secondary | ICD-10-CM | POA: Diagnosis not present

## 2022-11-26 DIAGNOSIS — G4733 Obstructive sleep apnea (adult) (pediatric): Secondary | ICD-10-CM | POA: Diagnosis not present

## 2022-11-30 ENCOUNTER — Encounter: Payer: Self-pay | Admitting: Internal Medicine

## 2022-12-08 ENCOUNTER — Other Ambulatory Visit: Payer: Self-pay | Admitting: Internal Medicine

## 2023-07-02 DIAGNOSIS — J029 Acute pharyngitis, unspecified: Secondary | ICD-10-CM | POA: Diagnosis not present

## 2023-07-02 DIAGNOSIS — R519 Headache, unspecified: Secondary | ICD-10-CM | POA: Diagnosis not present

## 2023-07-04 ENCOUNTER — Other Ambulatory Visit: Payer: Self-pay | Admitting: Internal Medicine

## 2023-07-19 ENCOUNTER — Other Ambulatory Visit: Payer: Self-pay | Admitting: Internal Medicine

## 2023-08-09 ENCOUNTER — Other Ambulatory Visit: Payer: Self-pay | Admitting: Internal Medicine

## 2023-08-12 ENCOUNTER — Other Ambulatory Visit: Payer: Self-pay | Admitting: Internal Medicine

## 2023-08-16 ENCOUNTER — Other Ambulatory Visit: Payer: Self-pay | Admitting: Internal Medicine

## 2023-09-02 ENCOUNTER — Other Ambulatory Visit: Payer: Self-pay | Admitting: Internal Medicine

## 2023-09-02 ENCOUNTER — Other Ambulatory Visit: Payer: Self-pay | Admitting: Cardiology

## 2023-09-02 NOTE — Telephone Encounter (Signed)
Pt has not been seen since 04/2021. Pt has had attempts, but still has not made an appt with Dr. Mayford Knife. Would Dr. Mayford Knife like to continue refilling this medication without an appt? Please address

## 2023-09-07 DIAGNOSIS — M5416 Radiculopathy, lumbar region: Secondary | ICD-10-CM | POA: Diagnosis not present

## 2023-09-07 NOTE — Telephone Encounter (Signed)
Call to patient to make appointment (overdue) so refills can be sent. Patient states he no longer takes crestor as he has lost 40 lbs. He also reports his PCP manages his cardiac issues at this time and he prefers to have his PCP manage so he doesn't have to drive from [place] to [place]. Patient states he sees his PCP in early February and will make an appointment with [place] if needed after he sees his PCP.

## 2023-09-16 ENCOUNTER — Encounter: Payer: Self-pay | Admitting: Internal Medicine

## 2023-09-16 MED ORDER — TADALAFIL 5 MG PO TABS: 5.0000 mg | ORAL_TABLET | Freq: Every day | ORAL | 0 refills | Status: DC

## 2023-09-16 MED ORDER — ESOMEPRAZOLE MAGNESIUM 40 MG PO CPDR: 40.0000 mg | DELAYED_RELEASE_CAPSULE | Freq: Every day | ORAL | 0 refills | Status: DC

## 2023-09-17 DIAGNOSIS — M5412 Radiculopathy, cervical region: Secondary | ICD-10-CM | POA: Diagnosis not present

## 2023-09-22 DIAGNOSIS — M5416 Radiculopathy, lumbar region: Secondary | ICD-10-CM | POA: Diagnosis not present

## 2023-10-05 ENCOUNTER — Encounter: Payer: Self-pay | Admitting: Internal Medicine

## 2023-10-05 ENCOUNTER — Ambulatory Visit (INDEPENDENT_AMBULATORY_CARE_PROVIDER_SITE_OTHER): Payer: BC Managed Care – PPO | Admitting: Internal Medicine

## 2023-10-05 VITALS — BP 108/60 | HR 62 | Temp 97.7°F | Resp 18 | Ht 71.0 in | Wt 232.1 lb

## 2023-10-05 DIAGNOSIS — M5442 Lumbago with sciatica, left side: Secondary | ICD-10-CM

## 2023-10-05 DIAGNOSIS — E669 Obesity, unspecified: Secondary | ICD-10-CM | POA: Diagnosis not present

## 2023-10-05 DIAGNOSIS — Z0000 Encounter for general adult medical examination without abnormal findings: Secondary | ICD-10-CM

## 2023-10-05 DIAGNOSIS — G8929 Other chronic pain: Secondary | ICD-10-CM

## 2023-10-05 DIAGNOSIS — G4733 Obstructive sleep apnea (adult) (pediatric): Secondary | ICD-10-CM

## 2023-10-05 DIAGNOSIS — E785 Hyperlipidemia, unspecified: Secondary | ICD-10-CM | POA: Diagnosis not present

## 2023-10-05 DIAGNOSIS — Z0001 Encounter for general adult medical examination with abnormal findings: Secondary | ICD-10-CM

## 2023-10-05 MED ORDER — TADALAFIL 5 MG PO TABS: 5.0000 mg | ORAL_TABLET | Freq: Every day | ORAL | 1 refills | Status: DC

## 2023-10-05 MED ORDER — LEVOCETIRIZINE DIHYDROCHLORIDE 5 MG PO TABS: 5.0000 mg | ORAL_TABLET | Freq: Every evening | ORAL | 1 refills | Status: DC

## 2023-10-05 MED ORDER — ESOMEPRAZOLE MAGNESIUM 40 MG PO CPDR: 40.0000 mg | DELAYED_RELEASE_CAPSULE | Freq: Every day | ORAL | 1 refills | Status: DC

## 2023-10-05 MED ORDER — WEGOVY 2.4 MG/0.75ML ~~LOC~~ SOAJ: 2.4000 mg | SUBCUTANEOUS | 3 refills | Status: DC

## 2023-10-05 MED ORDER — IBUPROFEN 800 MG PO TABS: 800.0000 mg | ORAL_TABLET | Freq: Two times a day (BID) | ORAL | 1 refills | Status: DC | PRN

## 2023-10-05 NOTE — Patient Instructions (Signed)
It was good to see you today.  Try to increase your physical activity.    GO TO THE LAB : Get the blood work     Next visit with me in 4 months Please schedule it at the front desk       "Health Care Power of attorney" ,  "Living will" (Advance care planning documents)  If you already have a living will or healthcare power of attorney, is recommended you bring the copy to be scanned in your chart.   The document will be available to all the doctors you see in the system.  Advance care planning is a process that supports adults in  understanding and sharing their preferences regarding future medical care.  The patient's preferences are recorded in documents called Advance Directives and the can be modified at any time while the patient is in full mental capacity.   If you don't have one, please consider create one.      More information at: [email or web address]

## 2023-10-05 NOTE — Progress Notes (Unsigned)
Subjective:    Patient ID: Timothy [name], male    DOB: 09/05/1965, 58 y.o.   MRN: [id number]  DOS:  10/05/2023 Type of visit - description: CPX  Here for CPX Chronic medical problems addressed. Started taking Wegovy several months ago.  + Weight loss.   Review of Systems See above   Past Medical History:  Diagnosis Date   Allergic rhinitis    Allergy    CAD (coronary artery disease), native coronary artery    Coronary CTA showed minimal coronary Ca at 5 with minimal CAD with < 25% stenosis in the RCA and LAD 05/2021   Chest pain 04/09/2008   stress test normal and echo (NI), GB US negative had a previous eval by cards as well   COVID-19    Erectile dysfunction    GERD (gastroesophageal reflux disease)    Hypertriglyceridemia    Right groin pain 2016   Dr. 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"    Past Surgical History:  Procedure Laterality Date   FRACTURE SURGERY     left leg   OTHER SURGICAL HISTORY     undescended testicle surgery, right   VASECTOMY  2009    Current Outpatient Medications  Medication Instructions   esomeprazole (NEXIUM) 40 mg, Oral, Daily before breakfast   fluticasone (FLONASE) 50 MCG/ACT nasal spray 2 sprays, Each Nare, Daily   ibuprofen (ADVIL) 800 mg, Oral, 2 times daily PRN, Due for CPX in December 24   levocetirizine (XYZAL) 5 mg, Oral, Every evening   rosuvastatin (CRESTOR) 10 mg, Oral, Daily   tadalafil (CIALIS) 5 mg, Oral, Daily   traZODone (DESYREL) 50 mg, Daily at bedtime   Wegovy 2.4 mg, Weekly       Objective:   Physical Exam BP 108/60   Pulse 62   Temp 97.7 F (36.5 C) (Oral)   Resp 18   Ht 5\' 11"  (1.803 m)   Wt 232 lb 2 oz (105.3 kg)   SpO2 99%   BMI 32.37 kg/m  General: Well developed, NAD, BMI noted Neck: No   thyromegaly  HEENT:  Normocephalic . Face symmetric, atraumatic Lungs:  CTA B Normal respiratory effort, no intercostal retractions, no accessory muscle use. Heart: RRR,  no murmur.  Abdomen:  Not distended, soft, non-tender. No rebound or rigidity.   Lower extremities: no pretibial edema bilaterally  Skin: Exposed areas without rash. Not pale. Not jaundice Neurologic:  alert & oriented X3.  Speech normal, gait appropriate for age and unassisted Strength symmetric and appropriate for age.  Psych: Cognition and judgment appear intact.  Cooperative with normal attention span and concentration.  Behavior appropriate. No anxious or depressed appearing.     Assessment   Assessment Dyslipidemia GERD Shingles 2010 ED  GU: --Undescended testicle, R, s/p unsuccessful surgery. -- UTI recurrent (third UTI dx 12-2014), groin pain >>>saw urology 02-2015-- rx [place] --Increased PSA 12-2014 in the context of a UTI, saw urology 02-2015, subsequent PSAs wnl Chronic  back pain onset ~2015, s/p local injection x 1 ~ 08-2014 ([facility]) Fatigue, DOE,CP: cards eval  04-2021. Coronary calcium score: 5, minimal CAD. Echo with a question of HOCM,   cardiac MRI done: no HCOM but did notice a hypermobile interatrial septum, Rx echo:  No PFO. Saw pulmonary: PFTs normal, sleep study + OSA, Rx CPAP. OSA: See above Aspirin allergy (able to take ibuprofen regularly)   PLAN: Here for CPX - Td 2017 -Recommend: Flu-COVID-vax- Shingrex  discussed, pros>con.    --CCS:  cscope 06/2017, cscope 09-2021, next per GI -Prostate cancer screening: No symptoms, check PSA --Diet, exercise: See comments under obesity --labs : CMP FLP CBC TSH PSA Obesity: On April 2024 started to get Wegovy from a online practitioner, initial weight was 264 pounds (BMI approximately 37), since then on his home scale he is down to 224 pounds (on our scales 232). Initially had some mild nausea and vomiting but is largely resolved. Request for  me to Rx Wegovy, will do.  Also recommend to bring blood work was done by the online y provider. High cholesterol: Ran out of rosuvastatin and decided not to refill it because he is doing better and losing weight.  Check FLP. OSA: Was never good using the CPAP consistently, eventually he stopped using it as he was losing weight.  Energy level is good. Chronic back pain: On ibuprofen 800 mg daily and Tylenol at night.  No apparent side effects. Multiple refills today. RTC 4 months.   ==== Dyslipidemia: On rosuvastatin, checking labs Chest pain, DOE, fatigue:  Feeling much better OSA: Has not been able to use CPAP consistently, encouraged to persist, consider use trazodone the first few weeks.  Consider newer implantable devices if unable to get used to the CPAP. Chronic back pain: takes ibuprofen 800 mg daily, encourage good GI precautions, decrease to 400 mg if possible. RTC 1 year

## 2023-10-06 ENCOUNTER — Encounter: Payer: Self-pay | Admitting: Internal Medicine

## 2023-10-06 LAB — COMPREHENSIVE METABOLIC PANEL
AG Ratio: 2.1 (calc) (ref 1.0–2.5)
ALT: 15 U/L (ref 9–46)
AST: 15 U/L (ref 10–35)
Albumin: 4.5 g/dL (ref 3.6–5.1)
Alkaline phosphatase (APISO): 50 U/L (ref 35–144)
BUN: 18 mg/dL (ref 7–25)
CO2: 25 mmol/L (ref 20–32)
Calcium: 9.2 mg/dL (ref 8.6–10.3)
Chloride: 104 mmol/L (ref 98–110)
Creat: 0.89 mg/dL (ref 0.70–1.30)
Globulin: 2.1 g/dL (ref 1.9–3.7)
Glucose, Bld: 97 mg/dL (ref 65–99)
Potassium: 4.3 mmol/L (ref 3.5–5.3)
Sodium: 137 mmol/L (ref 135–146)
Total Bilirubin: 0.9 mg/dL (ref 0.2–1.2)
Total Protein: 6.6 g/dL (ref 6.1–8.1)

## 2023-10-06 LAB — CBC WITH DIFFERENTIAL/PLATELET
Absolute Lymphocytes: 2067 {cells}/uL (ref 850–3900)
Absolute Monocytes: 423 {cells}/uL (ref 200–950)
Basophils Absolute: 39 {cells}/uL (ref 0–200)
Basophils Relative: 0.6 %
Eosinophils Absolute: 215 {cells}/uL (ref 15–500)
Eosinophils Relative: 3.3 %
HCT: 47.4 % (ref 38.5–50.0)
Hemoglobin: 15.9 g/dL (ref 13.2–17.1)
MCH: 30.1 pg (ref 27.0–33.0)
MCHC: 33.5 g/dL (ref 32.0–36.0)
MCV: 89.6 fL (ref 80.0–100.0)
MPV: 8.8 fL (ref 7.5–12.5)
Monocytes Relative: 6.5 %
Neutro Abs: 3757 {cells}/uL (ref 1500–7800)
Neutrophils Relative %: 57.8 %
Platelets: 230 10*3/uL (ref 140–400)
RBC: 5.29 10*6/uL (ref 4.20–5.80)
RDW: 13.4 % (ref 11.0–15.0)
Total Lymphocyte: 31.8 %
WBC: 6.5 10*3/uL (ref 3.8–10.8)

## 2023-10-06 LAB — LIPID PANEL
Cholesterol: 156 mg/dL (ref <200)
HDL: 37 mg/dL — ABNORMAL LOW (ref >=40)
LDL Cholesterol (Calc): 84 mg/dL
Non-HDL Cholesterol (Calc): 119 mg/dL (ref <130)
Total CHOL/HDL Ratio: 4.2 (calc) (ref <5.0)
Triglycerides: 269 mg/dL — ABNORMAL HIGH (ref <150)

## 2023-10-06 LAB — PSA: PSA: 1.16 ng/mL (ref <=4.00)

## 2023-10-06 LAB — TSH: TSH: 0.83 m[IU]/L (ref 0.40–4.50)

## 2023-10-06 NOTE — Telephone Encounter (Signed)
Please abstract the labs

## 2023-10-06 NOTE — Assessment & Plan Note (Signed)
Here for CPX - Td 2017 -Recommend: Flu-COVID-vax- Shingrex  discussed, pros>con. --CCS:  cscope 06/2017, cscope 09-2021, next per GI -Prostate cancer screening: No symptoms, check PSA --Diet, exercise: See comments under obesity --labs : CMP FLP CBC TSH PSA

## 2023-10-06 NOTE — Assessment & Plan Note (Signed)
Here for CPX We also address the following: Obesity: On April 2024 started to get [facility] from a online practitioner, initial weight was 264 pounds (BMI approximately 37), since then on his home scale he is down to 224 pounds (on our scales 232). Initially had some mild nausea and vomiting but that is largely resolved. Request for me to Rx Wegovy, will do.  Also recommend to bring blood work was done by the online y provider. High cholesterol: Ran out of rosuvastatin and decided not to refill it because he is doing better and losing weight.  Check FLP. OSA: Was never good using the CPAP consistently, eventually he stopped using it as he was losing weight.  Energy level is good. Chronic back pain: On ibuprofen 800 mg daily and Tylenol at night.  No apparent side effects. Multiple refills today. RTC 4 months.

## 2023-10-07 ENCOUNTER — Encounter: Payer: Self-pay | Admitting: Internal Medicine

## 2023-10-07 MED ORDER — ROSUVASTATIN CALCIUM 5 MG PO TABS: 5.0000 mg | ORAL_TABLET | Freq: Every day | ORAL | 0 refills | Status: DC

## 2023-10-07 NOTE — Addendum Note (Signed)
Addended by: Conrad Westhope D on: 10/07/2023 11:45 AM   Modules accepted: Orders

## 2023-10-21 ENCOUNTER — Encounter: Payer: Self-pay | Admitting: Internal Medicine

## 2023-11-25 DIAGNOSIS — L570 Actinic keratosis: Secondary | ICD-10-CM | POA: Diagnosis not present

## 2023-11-25 DIAGNOSIS — D225 Melanocytic nevi of trunk: Secondary | ICD-10-CM | POA: Diagnosis not present

## 2023-11-25 DIAGNOSIS — L2089 Other atopic dermatitis: Secondary | ICD-10-CM | POA: Diagnosis not present

## 2023-11-25 DIAGNOSIS — D2262 Melanocytic nevi of left upper limb, including shoulder: Secondary | ICD-10-CM | POA: Diagnosis not present

## 2023-11-25 DIAGNOSIS — D2261 Melanocytic nevi of right upper limb, including shoulder: Secondary | ICD-10-CM | POA: Diagnosis not present

## 2023-12-19 ENCOUNTER — Other Ambulatory Visit: Payer: Self-pay | Admitting: Internal Medicine

## 2023-12-30 ENCOUNTER — Encounter: Payer: Self-pay | Admitting: Internal Medicine

## 2024-01-03 ENCOUNTER — Other Ambulatory Visit: Payer: Self-pay | Admitting: Internal Medicine

## 2024-01-17 ENCOUNTER — Other Ambulatory Visit: Payer: Self-pay | Admitting: Internal Medicine

## 2024-02-04 ENCOUNTER — Encounter: Payer: Self-pay | Admitting: Internal Medicine

## 2024-02-04 ENCOUNTER — Ambulatory Visit: Payer: BC Managed Care – PPO | Admitting: Internal Medicine

## 2024-02-04 VITALS — BP 134/82 | HR 73 | Temp 98.2°F | Resp 16 | Ht 71.0 in | Wt 230.4 lb

## 2024-02-04 DIAGNOSIS — E785 Hyperlipidemia, unspecified: Secondary | ICD-10-CM | POA: Diagnosis not present

## 2024-02-04 DIAGNOSIS — M5442 Lumbago with sciatica, left side: Secondary | ICD-10-CM

## 2024-02-04 DIAGNOSIS — E669 Obesity, unspecified: Secondary | ICD-10-CM | POA: Diagnosis not present

## 2024-02-04 DIAGNOSIS — G8929 Other chronic pain: Secondary | ICD-10-CM | POA: Diagnosis not present

## 2024-02-04 NOTE — Patient Instructions (Addendum)
 Continue same medications.  Try to eat healthy 3 meals a day including fruits, salads, proteins (meat, chicken, fish, egg).  Avoid overeating  Recommend to see a nutritionist. Some of my patients go to Samantha Lorenzo at  [facility]  [employer] daily  Schedule a visit in 3 months.

## 2024-02-04 NOTE — Progress Notes (Unsigned)
 Subjective:    Patient ID: Timothy [name], male    DOB: 06-08-1966, 58 y.o.   MRN: [id number]  DOS:  02/04/2024 Type of visit - description: Follow-up  Here for follow-up, chronic medical problems addressed.  Wt Readings from Last 3 Encounters:  02/04/24 230 lb 6 oz (104.5 kg)  10/05/23 232 lb 2 oz (105.3 kg)  08/03/22 274 lb (124.3 kg)     Review of Systems See above   Past Medical History:  Diagnosis Date   Allergic rhinitis    Allergy    CAD (coronary artery disease), native coronary artery    Coronary CTA showed minimal coronary Ca at 5 with minimal CAD with < 25% stenosis in the RCA and LAD 05/2021   Chest pain 04/09/2008   stress test normal and echo (NI), GB us  negative had a previous eval by cards as well   COVID-19    Erectile dysfunction    GERD (gastroesophageal reflux disease)    Hypertriglyceridemia    Right groin pain 2016   Dr. Inga Manges, plan is renal US , and cystoscopy   Shingles 03/2009   Spinal stenosis at L4-L5 level    moderate   Spondylolisthesis    slight, across L4-5   Undescended right testicle    s/p surgery as a child---us  11-11 absent right testcile    Past Surgical History:  Procedure Laterality Date   FRACTURE SURGERY     left leg   OTHER SURGICAL HISTORY     undescended testicle surgery, right   VASECTOMY  2009    Current Outpatient Medications  Medication Instructions   esomeprazole  (NEXIUM ) 40 mg, Oral, Daily before breakfast   fluticasone  (FLONASE ) 50 MCG/ACT nasal spray 2 sprays, Each Nare, Daily   ibuprofen  (ADVIL ) 800 mg, Oral, 2 times daily PRN   levocetirizine (XYZAL ) 5 mg, Oral, Every evening   rosuvastatin  (CRESTOR ) 5 MG tablet TAKE 1 TABLET BY MOUTH EVERYDAY AT BEDTIME   tadalafil  (CIALIS ) 5 mg, Oral, Daily   traZODone (DESYREL) 50 mg, Daily at bedtime   Wegovy  2.4 mg, Subcutaneous, Weekly       Objective:   Physical Exam BP 134/82   Pulse 73   Temp 98.2 F (36.8 C) (Oral)   Resp 16   Ht 5' 11 (1.803 m)    Wt 230 lb 6 oz (104.5 kg)   SpO2 98%   BMI 32.13 kg/m  General:   Well developed, NAD, BMI noted. HEENT:  Normocephalic . Face symmetric, atraumatic Lungs:  CTA B Normal respiratory effort, no intercostal retractions, no accessory muscle use. Heart: RRR,  no murmur.  Lower extremities: no pretibial edema bilaterally  Skin: Not pale. Not jaundice Neurologic:  alert & oriented X3.  Speech normal, gait appropriate for age and unassisted Psych--  Cognition and judgment appear intact.  Cooperative with normal attention span and concentration.  Behavior appropriate. No anxious or depressed appearing.      Assessment   Assessment Dyslipidemia GERD ED  GU: --Undescended testicle, R, s/p unsuccessful surgery. -- UTI recurrent (third UTI dx 12-2014), groin pain >>>saw urology 02-2015-- rx US  --Increased PSA 12-2014 in the context of a UTI, saw urology 02-2015, subsequent PSAs wnl Chronic  back pain onset ~2015, s/p local injection x 1 ~ 08-2014 ([facility]) Fatigue, DOE,CP:  cards eval  04-2021.  Coronary calcium  score: 5, minimal CAD.  Echo with a question of HOCM, cardiac MRI done: no HCOM but did notice a hypermobile interatrial septum, Rx echo: No PFO.  OSA:  Saw pulmonary: PFTs normal, sleep study + OSA, Rx CPAP.  Not using it as of 01/2024 Obesity: Started Wegovy  with on micromanagement April 2024, initial weight 264 pounds BMI of 37 Aspirin allergy (able to take ibuprofen  regularly)  PLAN: Obesity: Started Wegovy  11/2022 with a online provided, initial weight at home 264, subsequently started to prescribe her medications 09/2023. Currently on Wegovy  2.5 mg weekly, weight at home has been steady at 220 pounds.  Unable to lose more. He is active in golf but describes his diet as terrible. Some days he eats pop tar and cereal and some other days goes to a [place] and eat chips understate. Having some GI side effects. Plan: We agreed he needs to change how he eats,  pointers provided including avoid overeating, avoid desserts (pop tarts), eat 3 healthy, small meals a day. Also advised to seek help from nutritionist. Reassess in 3 months. High cholesterol: Last LDL with Crestor  was 84, was recommended to go back on it.  Good compliance. OSA: Noted snoring as much, not using a CPAP, energy okay. Back pain: Better, on Motrin  in the morning and Tylenol  at night. RTC 3 months to reassess weight 2 Here for CPX - Td 2017 -Recommend: Flu-COVID-vax- Shingrex  discussed, pros>con. --CCS:  cscope 06/2017, cscope 09-2021, next per GI -Prostate cancer screening: No symptoms, check PSA --Diet, exercise: See comments under obesity --labs : CMP FLP CBC TSH PSA  We also address the following: Obesity: On April 2024 started to get Wegovy  from a online practitioner, initial weight was 264 pounds (BMI approximately 37), since then on his home scale he is down to 224 pounds (on our scales 232). Initially had some mild nausea and vomiting but that is largely resolved. Request for me to Rx Wegovy , will do.  Also recommend to bring blood work was done by the online y provider. High cholesterol: Ran out of rosuvastatin  and decided not to refill it because he is doing better and losing weight.  Check FLP. OSA: Was never good using the CPAP consistently, eventually he stopped using it as he was losing weight.  Energy level is good. Chronic back pain: On ibuprofen  800 mg daily and Tylenol  at night.  No apparent side effects. Multiple refills today. RTC 4 months.

## 2024-02-06 NOTE — Assessment & Plan Note (Signed)
 Obesity: Started Wegovy  11/2022 with a online provided, initial weight at home 264, BMI 37, I started to RF meds 09/2023. Currently on Wegovy  2.5 mg weekly, weight at home has been steady at 220 pounds.  Unable to lose more. He is active but describes his diet as terrible. Some days he eats pop tar and cereal and some other days goes to a [place] and eats a excessive amount of chips. Having some GI side effects. Plan: We agreed he needs to change how he eats, pointers provided including avoid overeating, avoid desserts (pop tarts), eat 3 healthy, small meals a day. Also advised to seek help from nutritionist. Reassess in 3 months. High cholesterol: Last LDL with Crestor  was 84, was recommended to go back on it.  Good compliance. OSA: Noted snoring as much, not using a CPAP, energy okay. Back pain: Better, on Motrin  in the morning and Tylenol  at night. RTC 3 months to reassess weight

## 2024-02-10 ENCOUNTER — Other Ambulatory Visit: Payer: Self-pay | Admitting: Internal Medicine

## 2024-03-13 ENCOUNTER — Other Ambulatory Visit: Payer: Self-pay | Admitting: Internal Medicine

## 2024-04-05 ENCOUNTER — Other Ambulatory Visit: Payer: Self-pay | Admitting: Internal Medicine

## 2024-04-13 ENCOUNTER — Other Ambulatory Visit: Payer: Self-pay | Admitting: Internal Medicine

## 2024-05-05 ENCOUNTER — Ambulatory Visit: Admitting: Internal Medicine

## 2024-05-08 ENCOUNTER — Other Ambulatory Visit: Payer: Self-pay | Admitting: Internal Medicine

## 2024-05-10 ENCOUNTER — Other Ambulatory Visit: Payer: Self-pay | Admitting: Family

## 2024-05-29 ENCOUNTER — Encounter: Payer: Self-pay | Admitting: Internal Medicine

## 2024-05-29 ENCOUNTER — Telehealth: Payer: Self-pay

## 2024-05-29 ENCOUNTER — Other Ambulatory Visit (HOSPITAL_COMMUNITY): Payer: Self-pay

## 2024-05-29 ENCOUNTER — Ambulatory Visit: Admitting: Internal Medicine

## 2024-05-29 VITALS — BP 118/84 | HR 83 | Temp 98.2°F | Resp 16 | Ht 71.0 in | Wt 235.2 lb

## 2024-05-29 DIAGNOSIS — G8929 Other chronic pain: Secondary | ICD-10-CM | POA: Diagnosis not present

## 2024-05-29 DIAGNOSIS — E669 Obesity, unspecified: Secondary | ICD-10-CM

## 2024-05-29 DIAGNOSIS — E785 Hyperlipidemia, unspecified: Secondary | ICD-10-CM

## 2024-05-29 DIAGNOSIS — M5442 Lumbago with sciatica, left side: Secondary | ICD-10-CM

## 2024-05-29 LAB — ALT: ALT: 16 U/L (ref 0–53)

## 2024-05-29 LAB — LIPID PANEL
Cholesterol: 81 mg/dL (ref 0–200)
HDL: 44 mg/dL (ref >39.00)
LDL Cholesterol: 7 mg/dL (ref 0–99)
NonHDL: 36.97
Total CHOL/HDL Ratio: 2
Triglycerides: 151 mg/dL — ABNORMAL HIGH (ref 0.0–149.0)
VLDL: 30.2 mg/dL (ref 0.0–40.0)

## 2024-05-29 LAB — AST: AST: 15 U/L (ref 0–37)

## 2024-05-29 MED ORDER — ZEPBOUND 7.5 MG/0.5ML ~~LOC~~ SOAJ: 7.5000 mg | SUBCUTANEOUS | 0 refills | Status: DC

## 2024-05-29 MED ORDER — ZEPBOUND 2.5 MG/0.5ML ~~LOC~~ SOAJ: 2.5000 mg | SUBCUTANEOUS | 0 refills | Status: DC

## 2024-05-29 NOTE — Telephone Encounter (Signed)
 We are trying to switch him from Wegovy  2.4mg  to Zepbound 7.5mg . Will need PA please.

## 2024-05-29 NOTE — Assessment & Plan Note (Signed)
 Obesity Managed with Wegovy  2.4 mg weekly.  He is doing great but not much improving in the last few months, diet is good except during the weekends. Discussed potential benefits of switch Wegovy  to Zepbound  if insurance approves.  Plan: - Prescribe Zepbound 7.5 mg for 4 weeks, then increase gradually to 10 mg, 12.5 mg, and 15 mg as tolerated. - Continue lifestyle modifications, focusing on dietary control, especially during weekends. - Continue being physically active doing yard work. Dyslipidemia Managed with Rosuvastatin  5 mg daily.  Labs. Vaccine advice: Declined any vaccines RTC February 2026 CPX

## 2024-05-29 NOTE — Patient Instructions (Addendum)
 GO TO THE LAB :  Get the blood work    Then, go to the front desk for the checkout Please make an appointment for a physical exam 09/2024  We sent a prescription for Zepbound  Please read more detailed instructions below   OBESITY: You are currently managing your weight with Wegovy  but have had difficulty with further weight loss, especially on weekends. -We discussed switching to Zepbound for potentially better results. If your insurance approves, you will start with 7.5 mg for 4 weeks, then increase to 10 mg, 12.5 mg, and 15 mg as tolerated. -Continue with your current lifestyle modifications, focusing on maintaining dietary control, especially during weekends. -Aim for at least 3 hours of moderate-intensity exercise per week.  DYSLIPIDEMIA: You are managing your cholesterol levels with Rosuvastatin  (Crestor ) 5 mg daily. -We will order a cholesterol blood test to monitor your levels.

## 2024-05-29 NOTE — Telephone Encounter (Signed)
 Pharmacy Patient Advocate Encounter   Received notification from CoverMyMeds that prior authorization for Zepbound 7.5MG /0.5ML pen-injectors  is required/requested.   Insurance verification completed.   The patient is insured through [facility].   Per test claim: PA required; PA submitted to above mentioned insurance via Latent Key/confirmation #/EOC [id number] Status is pending

## 2024-05-29 NOTE — Progress Notes (Signed)
 Subjective:    Patient ID: Timothy [name], male    DOB: 03-Sep-1965, 58 y.o.   MRN: [id number]  DOS:  05/29/2024 Follow-up  Discussed the use of AI scribe software for clinical note transcription with the patient, who gave verbal consent to proceed.  History of Present Illness   Obesity and weight management - Difficulty achieving further weight loss despite progress to date - Adheres to dietary regimen during weekdays - Struggles with dietary adherence on weekends, particularly in the summer due to increased beer consumption - Currently taking Wegovy  2.4 mg weekly for weight management  GLP-1 side effect: - Experiences a mild 'weird feeling' in the skin, especially on the back of the arms and back, which is less noticeable than previously - No nausea, vomiting, or constipation  Hyperlipidemia management - Takes Crestor  5 mg regularly for cholesterol management  Physical activity - Engages in yard work, frequently walks dogs, and plays pickleball - [place] increases physical activity - Believes he is getting sufficient exercise but acknowledges potential for improvement   Wt Readings from Last 3 Encounters:  05/29/24 235 lb 4 oz (106.7 kg)  02/04/24 230 lb 6 oz (104.5 kg)  10/05/23 232 lb 2 oz (105.3 kg)     Review of Systems See above   Past Medical History:  Diagnosis Date   Allergic rhinitis    Allergy    CAD (coronary artery disease), native coronary artery    Coronary CTA showed minimal coronary Ca at 5 with minimal CAD with < 25% stenosis in the RCA and LAD 05/2021   Chest pain 04/09/2008   stress test normal and echo (NI), GB us  negative had a previous eval by cards as well   COVID-19    Erectile dysfunction    GERD (gastroesophageal reflux disease)    Hypertriglyceridemia    Right groin pain 2016   Dr. Watt, plan is renal US , and cystoscopy   Shingles 03/2009   Spinal stenosis at L4-L5 level    moderate   Spondylolisthesis    slight, across  L4-5   Undescended right testicle    s/p surgery as a child---us  11-11 absent right testcile    Past Surgical History:  Procedure Laterality Date   FRACTURE SURGERY     left leg   OTHER SURGICAL HISTORY     undescended testicle surgery, right   VASECTOMY  2009    Current Outpatient Medications  Medication Instructions   esomeprazole  (NEXIUM ) 40 mg, Oral, Daily before breakfast   fluticasone  (FLONASE ) 50 MCG/ACT nasal spray 2 sprays, Each Nare, Daily   ibuprofen  (ADVIL ) 800 MG tablet TAKE 1 TABLET BY MOUTH 2 (TWO) TIMES DAILY AS NEEDED FOR MODERATE PAIN (PAIN SCORE 4-6).   levocetirizine (XYZAL ) 5 mg, Oral, Every evening   rosuvastatin  (CRESTOR ) 5 mg, Oral, Daily at bedtime   tadalafil  (CIALIS ) 5 mg, Oral, Daily   traZODone (DESYREL) 50 mg, Daily at bedtime   Wegovy  2.4 mg, Subcutaneous, Weekly, Needs appt       Objective:   Physical Exam BP 118/84   Pulse 83   Temp 98.2 F (36.8 C) (Oral)   Resp 16   Ht 5' 11 (1.803 m)   Wt 235 lb 4 oz (106.7 kg)   SpO2 98%   BMI 32.81 kg/m  General:   Well developed, NAD, BMI noted. HEENT:  Normocephalic . Face symmetric, atraumatic Skin: Not pale. Not jaundice Neurologic:  alert & oriented X3.  Speech normal, gait appropriate  for age and unassisted Psych--  Cognition and judgment appear intact.  Cooperative with normal attention span and concentration.  Behavior appropriate. No anxious or depressed appearing.      Assessment     Assessment Dyslipidemia GERD ED  GU: --Undescended testicle, R, s/p unsuccessful surgery. -- UTI recurrent (third UTI dx 12-2014), groin pain >>>saw urology 02-2015-- rx US  --Increased PSA 12-2014 in the context of a UTI, saw urology 02-2015, subsequent PSAs wnl Chronic  back pain onset ~2015, s/p local injection x 1 ~ 08-2014 ([facility]) Fatigue, DOE,CP:  cards eval  04-2021.  Coronary calcium  score: 5, minimal CAD.  Echo with a question of HOCM, cardiac MRI done: no HCOM but did notice a  hypermobile interatrial septum, Rx echo: No PFO. OSA:  Saw pulmonary: PFTs normal, sleep study + OSA, Rx CPAP.  Not using it as of 01/2024 Obesity: Started Wegovy  (online) April 2024, I started Rx 09/2023-- initial weight 264 pounds BMI of 37 Aspirin allergy (able to take ibuprofen  regularly)   Assessment & Plan Obesity Managed with Wegovy  2.4 mg weekly.  He is doing great but not much improving in the last few months, diet is good except during the weekends. Discussed potential benefits of switch Wegovy  to Zepbound  if insurance approves.  Plan: - Prescribe Zepbound 7.5 mg for 4 weeks, then increase gradually to 10 mg, 12.5 mg, and 15 mg as tolerated. - Continue lifestyle modifications, focusing on dietary control, especially during weekends. - Continue being physically active doing yard work. Dyslipidemia Managed with Rosuvastatin  5 mg daily.  Labs. Vaccine advice: Declined any vaccines RTC February 2026 CPX

## 2024-05-30 ENCOUNTER — Other Ambulatory Visit (HOSPITAL_COMMUNITY): Payer: Self-pay

## 2024-05-30 ENCOUNTER — Ambulatory Visit: Payer: Self-pay | Admitting: Internal Medicine

## 2024-05-30 NOTE — Addendum Note (Signed)
 Addended by: Shawntel Farnworth D on: 05/30/2024 12:55 PM   Modules accepted: Orders

## 2024-05-30 NOTE — Telephone Encounter (Signed)
 Pharmacy Patient Advocate Encounter  Insurance verification completed.   The patient is insured through [facility] has been completed for Zepbound 7.5MG /0.5ML pen-injectors. However there plan APPROVED WEGOVY . 05/30/2024 TO 12/28/2024  FROM CHART NOTES THERE WAS NOTHING IN CHART THAT STATED PT COULD NOT TOLERATE WEGOVY  AND QUESTION ASK HAS PT HAD INTOLERABLE TOLERANCE TO WEGOVY . LETTER SAYS WE REQUESTED WEGOVY  BUT WE DID  NOT OUT REQUEST WAS FOR ZEPBOUND.   PLEASE BE ADVISE APPROVAL LETTER WILL BE UPLOAD TO MEDIA OF CHART.  Ran test claim for:  WEGOVY2.4MG /0.75ML.                  Currently a quantity of  is a 28 day supply and the co-pay is $20.00. TEST BILLING    WHAT I PUT IN VS WHAT THEY SENT BACK

## 2024-05-30 NOTE — Telephone Encounter (Signed)
 Noted. Pt was wanting to switch to Zepbound, weight loss stalled on Wegovy .

## 2024-06-10 ENCOUNTER — Other Ambulatory Visit: Payer: Self-pay | Admitting: Internal Medicine

## 2024-06-12 ENCOUNTER — Encounter: Payer: Self-pay | Admitting: Internal Medicine

## 2024-06-16 ENCOUNTER — Other Ambulatory Visit (HOSPITAL_COMMUNITY): Payer: Self-pay

## 2024-06-16 ENCOUNTER — Telehealth: Payer: Self-pay

## 2024-06-16 MED ORDER — TIRZEPATIDE 7.5 MG/0.5ML ~~LOC~~ SOAJ: 7.5000 mg | SUBCUTANEOUS | 1 refills | Status: DC

## 2024-06-16 NOTE — Telephone Encounter (Signed)
 Pharmacy Patient Advocate Encounter  Received notification from [facility] that Prior Authorization for Mounjaro 7.5mg /0.75ml has been APPROVED from 06/16/24 to 02/14/25. Ran test claim, Copay is $20. This test claim was processed through [facility]- copay amounts may vary at other pharmacies due to pharmacy/plan contracts, or as the patient moves through the different stages of their insurance plan.   PA #/Case ID/Reference #: [id number]  Please send in a new prescription to the pharmacy for Mounjaro 7.5mg  for the patient.

## 2024-06-16 NOTE — Telephone Encounter (Signed)
 Pharmacy Patient Advocate Encounter   Received notification from Patient Advice Request messages that prior authorization for Zepbound 7.5mg /0.33ml is required/requested.   Insurance verification completed.   The patient is insured through [facility].   Per test claim: PA required; PA submitted to above mentioned insurance via Latent Key/confirmation #/EOC [facility] Status is pending

## 2024-06-27 ENCOUNTER — Other Ambulatory Visit: Payer: Self-pay | Admitting: Internal Medicine

## 2024-07-02 ENCOUNTER — Other Ambulatory Visit: Payer: Self-pay | Admitting: Internal Medicine

## 2024-07-13 ENCOUNTER — Other Ambulatory Visit: Payer: Self-pay | Admitting: Internal Medicine

## 2024-07-21 ENCOUNTER — Other Ambulatory Visit: Payer: Self-pay | Admitting: Internal Medicine

## 2024-07-24 ENCOUNTER — Other Ambulatory Visit: Payer: Self-pay

## 2024-07-24 MED ORDER — IBUPROFEN 800 MG PO TABS: 800.0000 mg | ORAL_TABLET | Freq: Two times a day (BID) | ORAL | 1 refills | Status: DC | PRN

## 2024-08-06 ENCOUNTER — Other Ambulatory Visit: Payer: Self-pay | Admitting: Internal Medicine

## 2024-09-05 ENCOUNTER — Encounter: Payer: Self-pay | Admitting: Internal Medicine

## 2024-09-06 MED ORDER — ZEPBOUND 10 MG/0.5ML ~~LOC~~ SOAJ: 10.0000 mg | SUBCUTANEOUS | 0 refills | Status: AC

## 2024-09-06 NOTE — Addendum Note (Signed)
 Addended by: Duwan Adrian D on: 09/06/2024 11:14 AM   Modules accepted: Orders

## 2024-09-06 NOTE — Telephone Encounter (Signed)
"  Okay to increase?  "

## 2024-09-13 ENCOUNTER — Telehealth: Payer: Self-pay

## 2024-09-13 NOTE — Telephone Encounter (Signed)
 Needs PA for Zepbound  please.

## 2024-09-14 ENCOUNTER — Telehealth: Payer: Self-pay

## 2024-09-14 ENCOUNTER — Other Ambulatory Visit (HOSPITAL_COMMUNITY): Payer: Self-pay

## 2024-09-14 NOTE — Telephone Encounter (Signed)
 Pharmacy Patient Advocate Encounter   Received notification from Pt Calls Messages that prior authorization for Zepbound  10mg /0.7ml is required/requested.   Insurance verification completed.   The patient is insured through [facility].   Per test claim: PA required; PA submitted to above mentioned insurance via Latent Key/confirmation #/[id number] Status is pending

## 2024-09-14 NOTE — Telephone Encounter (Signed)
 Pharmacy Patient Advocate Encounter  Received notification from [facility] that Prior Authorization for Zepbound  10mg /0.24ml has been APPROVED from 09/14/24 to 09/14/25   PA #/Case ID/Reference #: [id number]

## 2024-09-17 ENCOUNTER — Other Ambulatory Visit: Payer: Self-pay | Admitting: Family

## 2024-09-18 NOTE — Telephone Encounter (Signed)
 Refill request for ibuprofen  800mg . Okay to send?

## 2024-10-18 ENCOUNTER — Encounter: Admitting: Internal Medicine
